# Patient Record
Sex: Female | Born: 1990 | Race: White | Hispanic: No | Smoking: Former smoker
Health system: Southern US, Community
[De-identification: ages and names within clinical notes are randomized; demographics above are authoritative.]

## PROBLEM LIST (undated history)

## (undated) DIAGNOSIS — F32A Depression, unspecified: Secondary | ICD-10-CM

## (undated) DIAGNOSIS — R7303 Prediabetes: Secondary | ICD-10-CM

## (undated) DIAGNOSIS — F419 Anxiety disorder, unspecified: Secondary | ICD-10-CM

## (undated) DIAGNOSIS — F319 Bipolar disorder, unspecified: Secondary | ICD-10-CM

## (undated) DIAGNOSIS — F909 Attention-deficit hyperactivity disorder, unspecified type: Secondary | ICD-10-CM

## (undated) DIAGNOSIS — M5136 Other intervertebral disc degeneration, lumbar region: Secondary | ICD-10-CM

## (undated) DIAGNOSIS — M51369 Other intervertebral disc degeneration, lumbar region without mention of lumbar back pain or lower extremity pain: Secondary | ICD-10-CM

## (undated) DIAGNOSIS — M797 Fibromyalgia: Secondary | ICD-10-CM

## (undated) DIAGNOSIS — F329 Major depressive disorder, single episode, unspecified: Secondary | ICD-10-CM

## (undated) DIAGNOSIS — E119 Type 2 diabetes mellitus without complications: Secondary | ICD-10-CM

## (undated) HISTORY — DX: Anxiety disorder, unspecified: F41.9

## (undated) HISTORY — PX: CHOLECYSTECTOMY: SHX55

## (undated) HISTORY — DX: Depression, unspecified: F32.A

## (undated) HISTORY — DX: Major depressive disorder, single episode, unspecified: F32.9

---

## 2010-01-02 ENCOUNTER — Encounter: Payer: Self-pay | Admitting: Internal Medicine

## 2010-01-17 ENCOUNTER — Encounter (INDEPENDENT_AMBULATORY_CARE_PROVIDER_SITE_OTHER): Payer: Self-pay | Admitting: *Deleted

## 2010-01-17 ENCOUNTER — Encounter: Payer: Self-pay | Admitting: Internal Medicine

## 2010-02-09 ENCOUNTER — Encounter: Payer: Self-pay | Admitting: Internal Medicine

## 2010-02-09 LAB — CONVERTED CEMR LAB
ALT: 172 units/L — ABNORMAL HIGH
AST: 101 units/L — ABNORMAL HIGH
Total Bilirubin: 1.2 mg/dL — ABNORMAL HIGH

## 2010-02-10 LAB — CONVERTED CEMR LAB
ALT: 194 units/L
Alkaline Phosphatase: 69 units/L

## 2010-02-11 ENCOUNTER — Encounter: Payer: Self-pay | Admitting: Internal Medicine

## 2010-02-11 LAB — CONVERTED CEMR LAB
ALT: 115 units/L
AST: 28 units/L
Alkaline Phosphatase: 57 units/L

## 2010-02-12 ENCOUNTER — Encounter: Payer: Self-pay | Admitting: Internal Medicine

## 2010-02-12 LAB — CONVERTED CEMR LAB
AST: 22 units/L
Total Bilirubin: 0.6 mg/dL

## 2010-02-15 ENCOUNTER — Ambulatory Visit: Payer: Self-pay | Admitting: Internal Medicine

## 2010-02-15 DIAGNOSIS — R112 Nausea with vomiting, unspecified: Secondary | ICD-10-CM | POA: Insufficient documentation

## 2010-02-15 DIAGNOSIS — R740 Nonspecific elevation of levels of transaminase and lactic acid dehydrogenase [LDH]: Secondary | ICD-10-CM

## 2010-02-15 DIAGNOSIS — R109 Unspecified abdominal pain: Secondary | ICD-10-CM | POA: Insufficient documentation

## 2010-02-20 ENCOUNTER — Telehealth: Payer: Self-pay | Admitting: Internal Medicine

## 2010-02-20 ENCOUNTER — Ambulatory Visit: Payer: Self-pay | Admitting: Internal Medicine

## 2010-02-20 DIAGNOSIS — K560 Paralytic ileus: Secondary | ICD-10-CM

## 2010-02-20 LAB — CONVERTED CEMR LAB
AST: 19 units/L (ref 0–37)
Basophils Absolute: 0 10*3/uL (ref 0.0–0.1)
Basophils Relative: 0.4 % (ref 0.0–3.0)
Eosinophils Absolute: 0.1 10*3/uL (ref 0.0–0.7)
Eosinophils Relative: 1.4 % (ref 0.0–5.0)
Lymphocytes Relative: 44.4 % (ref 12.0–46.0)
MCHC: 35 g/dL (ref 30.0–36.0)
MCV: 86.5 fL (ref 78.0–100.0)
Neutro Abs: 4.2 10*3/uL (ref 1.4–7.7)
RBC: 4.62 M/uL (ref 3.87–5.11)

## 2010-02-21 ENCOUNTER — Ambulatory Visit: Payer: Self-pay | Admitting: Internal Medicine

## 2010-02-21 ENCOUNTER — Telehealth: Payer: Self-pay | Admitting: Internal Medicine

## 2010-03-11 ENCOUNTER — Ambulatory Visit (HOSPITAL_COMMUNITY): Admission: RE | Admit: 2010-03-11 | Discharge: 2010-03-11 | Payer: Self-pay | Admitting: Internal Medicine

## 2010-05-22 ENCOUNTER — Telehealth (INDEPENDENT_AMBULATORY_CARE_PROVIDER_SITE_OTHER): Payer: Self-pay | Admitting: *Deleted

## 2010-08-16 ENCOUNTER — Telehealth: Payer: Self-pay | Admitting: Internal Medicine

## 2010-10-15 NOTE — Progress Notes (Signed)
  Phone Note Other Incoming   Request: Send information Summary of Call: Request received from Disability Determination Services forwarded to [employer].       

## 2010-10-15 NOTE — Letter (Signed)
Summary: [facility]   Imported By: Sherian Rein 02/22/2010 08:12:26  _____________________________________________________________________  External Attachment:    Type:   Image     Comment:   External Document

## 2010-10-15 NOTE — Progress Notes (Signed)
Summary: Triage  Phone Note Call from Patient Call back at Home Phone [phone]   Caller: Mother--Sally   Call For: Dr. Leone Payor Summary of Call: Wants to know if Dr. Leone Payor has reviewed records from [facility]. Pt. is having constipation and vomiting Initial call taken by: Karna Christmas,  February 20, 2010 2:04 PM  Follow-up for Phone Call        see append on 02/15/10 note from Dr Leone Payor.  Patient  will come this afternoon for labs and x-ray.  Mother states that patient has had continued pain and vomiting.  Requesting her to have something for nausea.  I discussed with Amy Esterwood PA [place], new oprders for phenergan 25 mg 1/2-1 by mouth q 4-6 hours as needed vomiting # 25 0 refills.  Amy Esterwood PA also wants to add cbc to labs  Follow-up by: Darcey Nora RN, CGRN,  February 20, 2010 2:37 PM  Additional Follow-up for Phone Call Additional follow up Details #1::       Additional Follow-up by: Iva Boop MD, Clementeen Graham,  February 21, 2010 9:43 AM  New Problems: ILEUS (ICD-560.1)   New Problems: ILEUS (ICD-560.1) New/Updated Medications: PROMETHAZINE HCL 25 MG TABS (PROMETHAZINE HCL) 1/2 -1 by mouth q 4-6 hours as needed nausea Prescriptions: PROMETHAZINE HCL 25 MG TABS (PROMETHAZINE HCL) 1/2 -1 by mouth q 4-6 hours as needed nausea  #25 x 0   Entered by:   Darcey Nora RN, CGRN   Authorized by:   Sammuel Cooper PA-c   Signed by:   Darcey Nora RN, CGRN on 02/20/2010   Method used:   Electronically to        [facility] (retail)       [address]       [place], [place]  [address]       Ph: [phone]       Fax: [phone]   RxID:   [id number]

## 2010-10-15 NOTE — Assessment & Plan Note (Signed)
Summary: NAUSEA//VOMITING//ABD PAIN   History of Present Illness Visit Type: Initial Consult Primary GI MD: Stan Head MD [facility] Primary Provider: Thea Silversmith, MD Requesting Provider: Thea Silversmith, MD Chief Complaint: N7V, abdominal pain History of Present Illness:   20 yo ww here with mom.Patient was in [facility] for abdominal pain and constipation. She was  discharged 02/12/10. MRCP and xrays performed. She saw Dr. Karilyn Cota  and he ? if she passed stone had appt here already so kept it Eating less, poor appetite,  but gaining weight. Barely a meal. every time she eats anything she has to go to bathroom to either vomit or defecate. The stool softener is keeping your bowels moving. Prior she had been constipated and was in ED a week ago, given enemas, then back and admitted with pancreatitis and obstruction. Mom says LFT's went up after normal on admit. rectal bleeding and pain did not start til enemas.  random abdominal pain in mid abdomen, sporadic and unpredictable. May awaken at night. She has had problems since 2006 cholecystectomy for stones and upper abdominal pain radiating to the back - the current issues began a few months after. Had seen GP but not much was done other than diet changes with reduced fat. On Augmentin sine dc. UTI, ? otherwise.  Records review performed after patient visit: [facility] 5/28-5/31/11 Abdominal pain with elevated LFT's (were normal day before she presented/admitted) MRCP with normal CBD, Dr. Karilyn Cota suggested tertiary referral for biliary manometry ? sphincter of Oddi dysfunction  HPI indicatess this was third episode of acute abdominal pian since cholecystectomy with similar features, RUQ pain, nausea and vomiting. Then more diffuse pain.    GI Review of Systems    Reports abdominal pain, belching, bloating, chest pain, loss of appetite, nausea, vomiting, and  weight gain.     Location of  Abdominal pain: right side.    Denies  acid reflux, dysphagia with liquids, dysphagia with solids, heartburn, vomiting blood, and  weight loss.      Reports change in bowel habits, constipation, diarrhea, rectal bleeding, and  rectal pain.     Denies anal fissure, black tarry stools, diverticulosis, fecal incontinence, heme positive stool, hemorrhoids, irritable bowel syndrome, jaundice, light color stool, and  liver problems. [place] of Abdomen  Procedure date:  02/09/2010  Findings:      Normal post-chole limited pancreatic views  MRCP  Procedure date:  02/11/2010  Findings:      CBD max 5.2 mm small amount perihepatic ascites Otherwise normal.  X-ray  Procedure date:  02/09/2010  Findings:      Acute Abd early partial SBOvs ileus with distended jejunal loops compared to 02/08/10   Preventive Screening-Counseling & Management  Alcohol-Tobacco     Smoking Status: quit      Drug Use:  no.      Current Medications (verified): 1)  Depo-Provera 150 Mg/ml Susp (Medroxyprogesterone Acetate) .Marland Kitchen.. 1 Ml Im Every 3 Months 2)  Proair Hfa 108 (90 Base) Mcg/act Aers (Albuterol Sulfate) .... Take 2 Puffs Daily As Needed 3)  Levothyroxine Sodium 25 Mcg Tabs (Levothyroxine Sodium) .... Once Daily 4)  Augmentin 875-125 Mg Tabs (Amoxicillin-Pot Clavulanate) .... Two Times A Day  Allergies (verified): 1)  Ceclor  Past History:  Past Medical History: Bipolar Asthma Depression Gallstones  Past Surgical History: Cholecystectomy  Family History: Family History of Diabetes: Father, Mother Family History of Breast Cancer: Leukemia: Brother Cervicle Cancer: Mother Family History of Heart Disease: Mother Family History of Irritable Bowel  Syndrome:Mother Family History of Kidney Disease:Mother  Social History: Single, no children Occupation: Unemployed Patient is a former smoker.  Alcohol Use - no Illicit Drug Use - no Certificate of completion but no HS DiplomaSmoking Status:  quit Drug Use:  no  Review of  Systems       The patient complains of allergy/sinus, arthritis/joint pain, back pain, confusion, cough, depression-new, fatigue, fever, headaches-new, hearing problems, heart murmur, shortness of breath, skin rash, sleeping problems, sore throat, swelling of feet/legs, and urination - excessive.         Of Bipolar meds because weren't effective and sedating side effects. Looking for new psychiatrist. All other ROS negative except as per HPI.   Vital Signs:  Patient profile:   20 year old female Height:      60 inches Weight:      131.38 pounds BMI:     25.75 Pulse rate:   80 / minute Pulse rhythm:   regular BP sitting:   92 / 56  (left arm) Cuff size:   regular  Vitals Entered By: June McMurray CMA Duncan Dull) (February 15, 2010 8:31 AM)  Physical Exam  General:  Well developed, well nourished, no acute distress. Head:  Normocephalic and atraumatic. Eyes:  PERRLA, no icterus. Mouth:  No deformity or lesions, dentition normal. Neck:  Supple; no masses or thyromegaly. Lungs:  Clear throughout to auscultation. Heart:  Regular rate and rhythm; no murmurs, rubs,  or bruits. Abdomen:  mildly tender upper abdomen, right and epigastrium soft BS+ without HSM/mass abd tenderness worse wth mm tension Neurologic:  Alert and  oriented x4;  grossly normal neurologically. Cervical Nodes:  No significant cervical or supraclavicular adenopathy.  Psych:  slightly inappropriate (laughing ) at times   Impression & Recommendations:  Problem # 1:  ABDOMINAL PAIN, UPPER (ICD-789.09) Assessment New Three episodes since cholecystectomy. RUQ then more diffuse. this time we know LFT's rose and fell. No CBD stone on MRCP and no ductal dilation and no pancreatitis. Etiiology not clear and other objective data suggested ileus or partial SBO.  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 Seems unusual to be constipated since cholecystectomy but has been hypothyroid, but TSH normal when she was admitted. Need to  clarify if it is similar to pain pre chole.  Problem # 2:  TRANSAMINASES, SERUM, ELEVATED (ICD-790.4) Assessment: New ? from biliary source or other small amount of peri-heaptic ascites noted cause of this transient elevation with pain not clear but biliary tract remains leading suspect sphincter of Oiddi dysfunction is possible she is young and at higher risk of complications from ERCP but there may be a dynamic imaging study (non-invasive) possible.  will discuss options now that I have been able to review the [place] records.  Problem # 3:  NAUSEA AND VOMITING (ICD-787.01) Assessment: New Associated with the abdominal pain episodes.  Patient Instructions: 1)  Please continue current medications.  2)  We will call you with further plans and follow up after we receive and review your records from [facility]. 3)  Copy sent to : Valla Leaver, MD 4)  The medication list was reviewed and reconciled.  All changed / newly prescribed medications were explained.  A complete medication list was provided to the patient / caregiver.  Appended Document: NAUSEA//VOMITING//ABD PAIN ask her to repeat LFT's and 2 view abdomen to follow-up ileus/SBO  Appended Document: NAUSEA//VOMITING//ABD PAIN   Gastric Emptying Study  Procedure date:  03/11/2010  Findings:      Normal:  Let her know its normal How is she?  Appended Document: NAUSEA//VOMITING//ABD PAIN patient aware of results.  She states she is doing well "I really can't complain".  She is asked to call back for any questions or concerns  Appended Document: NAUSEA//VOMITING//ABD PAIN cc this to her PCP  Appended Document: NAUSEA//VOMITING//ABD PAIN sent to Dr Avel Peace

## 2010-10-15 NOTE — Letter (Signed)
Summary: [facility]   Imported By: Sherian Rein 02/22/2010 08:25:35  _____________________________________________________________________  External Attachment:    Type:   Image     Comment:   External Document

## 2010-10-15 NOTE — Procedures (Signed)
Summary: Upper Endoscopy  Patient: Amber Henry Note: All result statuses are Final unless otherwise noted.  Tests: (1) Upper Endoscopy (EGD)   EGD Upper Endoscopy       DONE     [facility]     [address]     [place], [place]  [address]           ENDOSCOPY PROCEDURE REPORT           PATIENT:  Amber, Henry  MR#:  [id number]     BIRTHDATE:  [birth year], 18 yrs. old  GENDER:  female           ENDOSCOPIST:  Iva Boop, MD, [facility]     Referred by:  Avel Peace, M.D.           PROCEDURE DATE:  02/21/2010     PROCEDURE:  EGD with biopsy     ASA CLASS:  Class II     INDICATIONS:  abdominal pain, nausea and vomiting           MEDICATIONS:   Benadryl 12.5 mg IV, Versed 7 mg IV, Fentanyl 75     mcg IV     TOPICAL ANESTHETIC:  Exactacain Spray           DESCRIPTION OF PROCEDURE:   After the risks benefits and     alternatives of the procedure were thoroughly explained, informed     consent was obtained.  The LB [id number]-H180 [id number] endoscope was     introduced through the mouth and advanced to the second portion of     the duodenum, without limitations.  The instrument was slowly     withdrawn as the mucosa was fully examined.     <<PROCEDUREIMAGES>>           Moderate gastritis was found in the body and the antrum of the     stomach. It was mottled and erythematous. Multiple biopsies were     obtained and sent to pathology.  Otherwise the examination was     normal. The Z-line was at 39 cm and the major papilla was seen and     normal.    Retroflexed views revealed no abnormalities.    The     scope was then withdrawn from the patient and the procedure     completed.           COMPLICATIONS:  None           ENDOSCOPIC IMPRESSION:     1) Moderate gastritis in the body and the antrum of the stomach           2) Otherwise normal examination     RECOMMENDATIONS:     1) Await biopsy results     Take MiraLax 1-2 doses each day for constipation. Start      hyoscyamine 0.125 mg sublingual as needed every 4 hours for     abdominal pain (prescription sent)           REPEAT EXAM:  In for as needed.           Iva Boop, MD, Clementeen Graham           CC:  Avel Peace, MD and The Patient           n.     eSIGNED:   Iva Boop at 02/21/2010 12:59 PM           Colon Flattery, [id number]  Note: An exclamation mark Marland Kitchen)  indicates a result that was not dispersed into the flowsheet. Document Creation Date: 02/21/2010 12:59 PM _______________________________________________________________________  (1) Order result status: Final Collection or observation date-time: 02/21/2010 12:45 Requested date-time:  Receipt date-time:  Reported date-time:  Referring Physician:   Ordering Physician: Stan Head [phone]) Specimen Source:  Source: Launa Grill Order Number: [id number] Lab site:

## 2010-10-15 NOTE — Letter (Signed)
Summary: Consult/[facility]   Imported By: Sherian Rein 02/22/2010 08:23:29  _____________________________________________________________________  External Attachment:    Type:   Image     Comment:   External Document

## 2010-10-15 NOTE — Progress Notes (Signed)
Summary: Schedule EGD  Phone Note Outgoing Call   Summary of Call: let her know labs and xray are ok is going to need some more diagnostic testing and I recommend an EGD re abdominal pain and vomiting. could do Mon at [facility] or possibly 3ish today in [facility] if we can pull that of Initial call taken by: Iva Boop MD, Clementeen Graham,  February 21, 2010 9:46 AM  Follow-up for Phone Call        patient is scheduled for today at 3:00 in [facility].  I have reviewed insturctions with the patient's mom. Follow-up by: Darcey Nora RN, CGRN,  February 21, 2010 10:14 AM     Appended Document: Schedule EGD -

## 2010-10-15 NOTE — Letter (Signed)
Summary: [facility]   Imported By: Sherian Rein 02/22/2010 08:14:51  _____________________________________________________________________  External Attachment:    Type:   Image     Comment:   External Document

## 2010-10-15 NOTE — Letter (Signed)
Summary: New Patient letter  [facility]  [address] [place], [place] [address]   Phone: [phone]  Fax: [phone]       01/17/2010 MRN: [id number]  [facility] WHITE [address] [place], [place]  [address]  Dear Ms. WHITE,  Welcome to the Gastroenterology Division at [facility]    You are scheduled to see Dr. Leone Payor on 02-15-10 at 8:45a.m. on the 3rd floor at [facility], [address]  We ask that you try to arrive at our office 15 minutes prior to your appointment time to allow for check-in.  We would like you to complete the enclosed self-administered evaluation form prior to your visit and bring it with you on the day of your appointment.  We will review it with you.  Also, please bring a complete list of all your medications or, if you prefer, bring the medication bottles and we will list them.  Please bring your insurance card so that we may make a copy of it.  If your insurance requires a referral to see a specialist, please bring your referral form from your primary care physician.  Co-payments are due at the time of your visit and may be paid by cash, check or credit card.     Your office visit will consist of a consult with your physician (includes a physical exam), any laboratory testing he/she may order, scheduling of any necessary diagnostic testing (e.g. x-ray, ultrasound, CT-scan), and scheduling of a procedure (e.g. Endoscopy, Colonoscopy) if required.  Please allow enough time on your schedule to allow for any/all of these possibilities.    If you cannot keep your appointment, please call [phone] to cancel or reschedule prior to your appointment date.  This allows [place] the opportunity to schedule an appointment for another patient in need of care.  If you do not cancel or reschedule by 5 p.m. the business day prior to your appointment date, you will be charged a $50.00 late cancellation/no-show fee.    Thank you for choosing [facility] for your medical needs.  We appreciate the opportunity to care for you.  Please visit [place] at our website  to learn more about our practice.                     Sincerely,                                                             The Gastroenterology Division
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
## 2010-12-20 ENCOUNTER — Emergency Department (HOSPITAL_COMMUNITY)
Admission: EM | Admit: 2010-12-20 | Discharge: 2010-12-20 | Disposition: A | Discharge status: Home / Self Care | Payer: Medicaid Other | Attending: Emergency Medicine | Admitting: Emergency Medicine

## 2010-12-20 DIAGNOSIS — R05 Cough: Secondary | ICD-10-CM | POA: Insufficient documentation

## 2010-12-20 DIAGNOSIS — R059 Cough, unspecified: Secondary | ICD-10-CM | POA: Insufficient documentation

## 2010-12-20 DIAGNOSIS — J45909 Unspecified asthma, uncomplicated: Secondary | ICD-10-CM | POA: Insufficient documentation

## 2010-12-20 DIAGNOSIS — F319 Bipolar disorder, unspecified: Secondary | ICD-10-CM | POA: Insufficient documentation

## 2010-12-20 DIAGNOSIS — E039 Hypothyroidism, unspecified: Secondary | ICD-10-CM | POA: Insufficient documentation

## 2011-05-20 ENCOUNTER — Other Ambulatory Visit (HOSPITAL_COMMUNITY): Payer: Self-pay | Admitting: "Endocrinology

## 2011-05-20 DIAGNOSIS — N6452 Nipple discharge: Secondary | ICD-10-CM

## 2011-05-22 ENCOUNTER — Other Ambulatory Visit (HOSPITAL_COMMUNITY): Payer: Self-pay | Admitting: "Endocrinology

## 2011-05-23 ENCOUNTER — Other Ambulatory Visit (HOSPITAL_COMMUNITY): Payer: Self-pay | Admitting: "Endocrinology

## 2011-05-27 ENCOUNTER — Ambulatory Visit (HOSPITAL_COMMUNITY): Payer: Medicaid Other

## 2011-05-28 ENCOUNTER — Ambulatory Visit (HOSPITAL_COMMUNITY)
Admission: RE | Admit: 2011-05-28 | Discharge: 2011-05-28 | Disposition: A | Discharge status: Home / Self Care | Payer: Medicaid Other | Source: Ambulatory Visit | Attending: "Endocrinology | Admitting: "Endocrinology

## 2011-05-28 DIAGNOSIS — N6452 Nipple discharge: Secondary | ICD-10-CM

## 2011-05-28 DIAGNOSIS — N6459 Other signs and symptoms in breast: Secondary | ICD-10-CM | POA: Insufficient documentation

## 2011-05-29 ENCOUNTER — Ambulatory Visit (HOSPITAL_COMMUNITY)
Admission: RE | Admit: 2011-05-29 | Discharge: 2011-05-29 | Disposition: A | Discharge status: Home / Self Care | Payer: Medicaid Other | Source: Ambulatory Visit | Attending: "Endocrinology | Admitting: "Endocrinology

## 2011-05-29 DIAGNOSIS — E236 Other disorders of pituitary gland: Secondary | ICD-10-CM | POA: Insufficient documentation

## 2011-05-29 MED ORDER — GADOBENATE DIMEGLUMINE 529 MG/ML IV SOLN
7.0000 mL | Freq: Once | INTRAVENOUS | Status: AC | PRN
  Administered 2011-05-29: 7 mL via INTRAVENOUS

## 2011-06-04 ENCOUNTER — Ambulatory Visit (HOSPITAL_COMMUNITY): Payer: Medicaid Other

## 2011-09-05 ENCOUNTER — Encounter: Payer: Self-pay | Admitting: Emergency Medicine

## 2011-09-05 ENCOUNTER — Emergency Department (HOSPITAL_COMMUNITY)
Admission: EM | Admit: 2011-09-05 | Discharge: 2011-09-05 | Disposition: A | Discharge status: Home / Self Care | Payer: Medicaid Other | Attending: Emergency Medicine | Admitting: Emergency Medicine

## 2011-09-05 DIAGNOSIS — F909 Attention-deficit hyperactivity disorder, unspecified type: Secondary | ICD-10-CM | POA: Insufficient documentation

## 2011-09-05 DIAGNOSIS — Z79899 Other long term (current) drug therapy: Secondary | ICD-10-CM | POA: Insufficient documentation

## 2011-09-05 DIAGNOSIS — W108XXA Fall (on) (from) other stairs and steps, initial encounter: Secondary | ICD-10-CM | POA: Insufficient documentation

## 2011-09-05 DIAGNOSIS — F411 Generalized anxiety disorder: Secondary | ICD-10-CM | POA: Insufficient documentation

## 2011-09-05 DIAGNOSIS — M5380 Other specified dorsopathies, site unspecified: Secondary | ICD-10-CM | POA: Insufficient documentation

## 2011-09-05 DIAGNOSIS — IMO0001 Reserved for inherently not codable concepts without codable children: Secondary | ICD-10-CM | POA: Insufficient documentation

## 2011-09-05 DIAGNOSIS — R450 Nervousness: Secondary | ICD-10-CM | POA: Insufficient documentation

## 2011-09-05 DIAGNOSIS — F319 Bipolar disorder, unspecified: Secondary | ICD-10-CM | POA: Insufficient documentation

## 2011-09-05 DIAGNOSIS — M545 Low back pain, unspecified: Secondary | ICD-10-CM | POA: Insufficient documentation

## 2011-09-05 DIAGNOSIS — S335XXA Sprain of ligaments of lumbar spine, initial encounter: Secondary | ICD-10-CM | POA: Insufficient documentation

## 2011-09-05 DIAGNOSIS — J45909 Unspecified asthma, uncomplicated: Secondary | ICD-10-CM | POA: Insufficient documentation

## 2011-09-05 DIAGNOSIS — S39012A Strain of muscle, fascia and tendon of lower back, initial encounter: Secondary | ICD-10-CM

## 2011-09-05 DIAGNOSIS — S20229A Contusion of unspecified back wall of thorax, initial encounter: Secondary | ICD-10-CM

## 2011-09-05 HISTORY — DX: Attention-deficit hyperactivity disorder, unspecified type: F90.9

## 2011-09-05 HISTORY — DX: Prediabetes: R73.03

## 2011-09-05 HISTORY — DX: Fibromyalgia: M79.7

## 2011-09-05 HISTORY — DX: Bipolar disorder, unspecified: F31.9

## 2011-09-05 MED ORDER — HYDROMORPHONE HCL PF 1 MG/ML IJ SOLN
1.0000 mg | Freq: Once | INTRAMUSCULAR | Status: AC
  Administered 2011-09-05: 1 mg via INTRAMUSCULAR
  Filled 2011-09-05: qty 1

## 2011-09-05 MED ORDER — HYDROCODONE-ACETAMINOPHEN 7.5-325 MG PO TABS: 1.0000 | ORAL_TABLET | ORAL | Status: AC | PRN

## 2011-09-05 MED ORDER — DEXAMETHASONE SODIUM PHOSPHATE 4 MG/ML IJ SOLN
8.0000 mg | Freq: Once | INTRAMUSCULAR | Status: AC
  Administered 2011-09-05: 8 mg via INTRAMUSCULAR
  Filled 2011-09-05: qty 2

## 2011-09-05 MED ORDER — DEXAMETHASONE 6 MG PO TABS: ORAL_TABLET | ORAL | Status: AC

## 2011-09-05 MED ORDER — ONDANSETRON HCL 4 MG PO TABS
4.0000 mg | ORAL_TABLET | Freq: Once | ORAL | Status: AC
  Administered 2011-09-05: 4 mg via ORAL
  Filled 2011-09-05: qty 1

## 2011-09-05 NOTE — ED Notes (Signed)
Pt presents with low back pain. Pt states she fell backwards onto her back yesterday. Pt states she has a ruptured disc and "fluid is leaking up and down my spine". No swelling, bruising, or step-off noted to back. Pt states she tried tasking Tylenol for pain but no relief was obtained from medication. Pt states she is out of her pain medication and was told to come to ED from her back specialist. NAD at this time.

## 2011-09-05 NOTE — ED Provider Notes (Signed)
History     CSN: [id number]  Arrival date & time 09/05/11  1057   None     Chief Complaint  Patient presents with  . Fall  . Back Pain    (Consider location/radiation/quality/duration/timing/severity/associated sxs/prior treatment) HPI Comments: Patient states that she fell down approximately 2 steps on yesterday December 20. It is of note she has history of fibromyalgia and degenerative disc disease. She has had increasing lower back pain and spasm since the fall. She denies loss of bowel or bladder function. She requests assistance with her pain at this time.  Patient is a 20 y.o. female presenting with fall and back pain. The history is provided by the patient.  Fall The accident occurred yesterday. Incident: fell down 2 stairs. She landed on concrete. Point of impact: lower back. The pain is severe. She was ambulatory at the scene. Pertinent negatives include no numbness, no abdominal pain, no bowel incontinence and no hematuria.  Back Pain  Pertinent negatives include no chest pain, no numbness, no abdominal pain, no bowel incontinence and no dysuria.    Past Medical History  Diagnosis Date  . Borderline diabetes   . Fibromyalgia   . ADHD (attention deficit hyperactivity disorder)   . Asthma   . Bipolar 1 disorder     History reviewed. No pertinent past surgical history.  History reviewed. No pertinent family history.  History  Substance Use Topics  . Smoking status: Not on file  . Smokeless tobacco: Not on file  . Alcohol Use: No    OB History    Grav Para Term Preterm Abortions TAB SAB Ect Mult Living                  Review of Systems  Constitutional: Negative for activity change.       All ROS Neg except as noted in HPI  HENT: Negative for nosebleeds and neck pain.   Eyes: Negative for photophobia and discharge.  Respiratory: Negative for cough, shortness of breath and wheezing.   Cardiovascular: Negative for chest pain and palpitations.    Gastrointestinal: Negative for abdominal pain, blood in stool and bowel incontinence.  Genitourinary: Negative for dysuria, frequency and hematuria.  Musculoskeletal: Positive for myalgias, back pain and arthralgias.  Skin: Negative.   Neurological: Negative for dizziness, seizures, speech difficulty and numbness.  Psychiatric/Behavioral: Negative for hallucinations and confusion. The patient is nervous/anxious.     Allergies  Peanut butter flavor; Amoxicillin; Bactrim; Cefaclor; and Penicillins  Home Medications   Current Outpatient Rx  Name Route Sig Dispense Refill  . ACETAMINOPHEN 500 MG PO TABS Oral Take 1,000 mg by mouth every 6 (six) hours as needed. For pain     . ARIPIPRAZOLE 5 MG PO TABS Oral Take 5 mg by mouth daily.      Marland Kitchen LANSOPRAZOLE 30 MG PO CPDR Oral Take 30 mg by mouth daily.      Marland Kitchen LEVOTHYROXINE SODIUM 75 MCG PO TABS Oral Take 75 mcg by mouth daily.      . ICY HOT BACK EX Apply externally Apply 1 patch topically 2 (two) times daily.      Marland Kitchen PREGABALIN 25 MG PO CAPS Oral Take 25 mg by mouth 3 (three) times daily.      Marland Kitchen TEMAZEPAM 30 MG PO CAPS Oral Take 30 mg by mouth at bedtime as needed. For sleep       BP 130/69  Pulse 84  Temp(Src) 97.5 F (36.4 C) (Oral)  Resp 18  Ht 5\' 2"  (1.575 m)  Wt 158 lb (71.668 kg)  BMI 28.90 kg/m2  SpO2 100%  LMP 09/03/2011  Physical Exam  Nursing note and vitals reviewed. Constitutional: She is oriented to person, place, and time. She appears well-developed and well-nourished.  Non-toxic appearance.  HENT:  Head: Normocephalic.  Right Ear: Tympanic membrane and external ear normal.  Left Ear: Tympanic membrane and external ear normal.  Eyes: EOM and lids are normal. Pupils are equal, round, and reactive to light.  Neck: Normal range of motion. Neck supple. Carotid bruit is not present.  Cardiovascular: Normal rate, regular rhythm, normal heart sounds, intact distal pulses and normal pulses.   Pulmonary/Chest: Breath sounds  normal. No respiratory distress.  Abdominal: Soft. Bowel sounds are normal. There is no tenderness. There is no guarding.  Musculoskeletal: Normal range of motion.       Spasm noted of the right and left lower back. Pain with range of motion of the lumbar area. No acute bruising noted. No palpable step down in the thoracic or lumbar area.  Lymphadenopathy:       Head (right side): No submandibular adenopathy present.       Head (left side): No submandibular adenopathy present.    She has no cervical adenopathy.  Neurological: She is alert and oriented to person, place, and time. She has normal strength. No cranial nerve deficit or sensory deficit.  Skin: Skin is warm and dry.  Psychiatric: Her speech is normal. Her mood appears anxious.    ED Course  Procedures (including critical care time) Pulse oximetry 100% on room air. Within normal limits by my interpretation. Labs Reviewed - No data to display No results found.   No diagnosis found.    MDM  I have reviewed nursing notes, vital signs, and all appropriate lab and imaging results for this patient. Back pain present that is worse than usual after stain and fall. Rx for Norco 7.5 and Decadron given. Pt to see her MD for recheck.        Kathie Dike, [place] 09/05/11 [phone]

## 2011-09-05 NOTE — ED Notes (Signed)
Pt a/ox4. Resp even and unlabored. NAD at this time. D/C instructions and Rx x2 reviewed with pt. Pt verbalized understanding. Pt ambulated to lobby with steady gate.  

## 2011-09-05 NOTE — ED Notes (Signed)
Pt states she fell yesterday and c/o increased back pain. Pt states she has chronic back pain issues.

## 2011-09-06 NOTE — ED Provider Notes (Signed)
Medical screening examination/treatment/procedure(s) were performed by non-physician practitioner and as supervising physician I was immediately available for consultation/collaboration.    Nelia Shi, MD 09/06/11 [phone]

## 2012-08-06 ENCOUNTER — Emergency Department (HOSPITAL_COMMUNITY): Payer: Medicaid Other

## 2012-08-06 ENCOUNTER — Emergency Department (HOSPITAL_COMMUNITY)
Admission: EM | Admit: 2012-08-06 | Discharge: 2012-08-06 | Disposition: A | Discharge status: Home / Self Care | Payer: Medicaid Other | Attending: Emergency Medicine | Admitting: Emergency Medicine

## 2012-08-06 ENCOUNTER — Encounter (HOSPITAL_COMMUNITY): Payer: Self-pay | Admitting: *Deleted

## 2012-08-06 DIAGNOSIS — M5431 Sciatica, right side: Secondary | ICD-10-CM

## 2012-08-06 DIAGNOSIS — Z79899 Other long term (current) drug therapy: Secondary | ICD-10-CM | POA: Insufficient documentation

## 2012-08-06 DIAGNOSIS — F909 Attention-deficit hyperactivity disorder, unspecified type: Secondary | ICD-10-CM | POA: Insufficient documentation

## 2012-08-06 DIAGNOSIS — M545 Low back pain, unspecified: Secondary | ICD-10-CM | POA: Insufficient documentation

## 2012-08-06 DIAGNOSIS — IMO0001 Reserved for inherently not codable concepts without codable children: Secondary | ICD-10-CM | POA: Insufficient documentation

## 2012-08-06 DIAGNOSIS — F319 Bipolar disorder, unspecified: Secondary | ICD-10-CM | POA: Insufficient documentation

## 2012-08-06 DIAGNOSIS — M51379 Other intervertebral disc degeneration, lumbosacral region without mention of lumbar back pain or lower extremity pain: Secondary | ICD-10-CM | POA: Insufficient documentation

## 2012-08-06 DIAGNOSIS — E119 Type 2 diabetes mellitus without complications: Secondary | ICD-10-CM | POA: Insufficient documentation

## 2012-08-06 DIAGNOSIS — M5137 Other intervertebral disc degeneration, lumbosacral region: Secondary | ICD-10-CM | POA: Insufficient documentation

## 2012-08-06 DIAGNOSIS — J45909 Unspecified asthma, uncomplicated: Secondary | ICD-10-CM | POA: Insufficient documentation

## 2012-08-06 DIAGNOSIS — M5430 Sciatica, unspecified side: Secondary | ICD-10-CM | POA: Insufficient documentation

## 2012-08-06 HISTORY — DX: Other intervertebral disc degeneration, lumbar region: M51.36

## 2012-08-06 HISTORY — DX: Other intervertebral disc degeneration, lumbar region without mention of lumbar back pain or lower extremity pain: M51.369

## 2012-08-06 LAB — URINE MICROSCOPIC-ADD ON

## 2012-08-06 LAB — URINALYSIS, ROUTINE W REFLEX MICROSCOPIC
Bilirubin Urine: NEGATIVE
Glucose, UA: NEGATIVE mg/dL
Specific Gravity, Urine: 1.02 (ref 1.005–1.030)
Urobilinogen, UA: 0.2 mg/dL (ref 0.0–1.0)
pH: 6 (ref 5.0–8.0)

## 2012-08-06 LAB — PREGNANCY, URINE: Preg Test, Ur: NEGATIVE

## 2012-08-06 MED ORDER — HYDROCODONE-ACETAMINOPHEN 5-325 MG PO TABS: ORAL_TABLET | ORAL | Status: DC

## 2012-08-06 MED ORDER — HYDROCODONE-ACETAMINOPHEN 5-325 MG PO TABS
1.0000 | ORAL_TABLET | Freq: Once | ORAL | Status: AC
  Administered 2012-08-06: 1 via ORAL
  Filled 2012-08-06: qty 1

## 2012-08-06 NOTE — ED Notes (Signed)
Back pain, Has been seen at 3 hospitals for same. Given pain med. At ER.  And prednisone.    Had sudden onset of pain in back when bent over to pick up clothing.

## 2012-08-06 NOTE — ED Provider Notes (Signed)
History     CSN: [id number]  Arrival date & time 08/06/12  1646   First MD Initiated Contact with Patient 08/06/12 1706      Chief Complaint  Patient presents with  . 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  She was seen in [place] and dx with UTI.  She denies having any UTI sxs at time of dx or presently.  The macrobid has not improved her sxs.  No fever or chills.  She has also been seen at [facility] and [facility].  She was given pain meds at both places.   She does not know the diagnoses given at those two ED's.  She has a PCP in [place] and was referred to a "spine specialist" here in [place].  She has appts to see both of them in 3 days.  LMP-began yest.  Patient is a 21 y.o. female presenting with back pain. The history is provided by the patient. No language interpreter was used.  Back Pain  This is a new problem. Episode onset: ~ 4 days ago. The problem occurs constantly. The problem has not changed since onset.Associated with: pain started when she bent over to get some clothes out of a dresser drawer. The pain is present in the lumbar spine. The quality of the pain is described as stabbing. The pain radiates to the left foot. The pain is severe. The symptoms are aggravated by bending, twisting and certain positions. Pertinent negatives include no fever, no dysuria and no pelvic pain.    Past Medical History  Diagnosis Date  . Borderline diabetes   . Fibromyalgia   . ADHD (attention deficit hyperactivity disorder)   . Asthma   . Bipolar 1 disorder   . DDD (degenerative disc disease), lumbar     Past Surgical History  Procedure Date  . Cholecystectomy     History reviewed. No pertinent family history.  History  Substance Use Topics  . Smoking status: Not on file  . Smokeless tobacco: Not on file  . Alcohol Use: No     OB History    Grav Para Term Preterm Abortions TAB SAB Ect Mult Living                  Review of Systems  Constitutional: Negative for fever and chills.  Genitourinary: Positive for vaginal bleeding. Negative for dysuria, urgency, frequency, hematuria, flank pain, vaginal discharge, vaginal pain and pelvic pain.  Musculoskeletal: Positive for back pain.  All other systems reviewed and are negative.    Allergies  Peanut butter flavor; Amoxicillin; Bactrim; Penicillins; Cefaclor; and Latex  Home Medications   Current Outpatient Rx  Name  Route  Sig  Dispense  Refill  . ARIPIPRAZOLE 15 MG PO TABS   Oral   Take 15 mg by mouth daily.         Marland Kitchen CETIRIZINE HCL 10 MG PO TABS   Oral   Take 10 mg by mouth daily.         . CHOLECALCIFEROL 400 UNITS PO TABS   Oral   Take 400 Units by mouth daily.         Marland Kitchen DICLOFENAC SODIUM 1 % TD GEL   Topical   Apply 1 application topically daily as needed.         . DULOXETINE HCL 60 MG PO CPEP   Oral   Take 60  mg by mouth daily.         Marland Kitchen FLUTICASONE PROPIONATE 50 MCG/ACT NA SUSP   Nasal   Place 2 sprays into the nose daily as needed. For allergies and congestion         . HYDROXYZINE HCL 50 MG PO TABS   Oral   Take 50-100 mg by mouth 2 (two) times daily. Take one tablet in the morning and two tablets at bedtime         . IBUPROFEN 800 MG PO TABS   Oral   Take 800 mg by mouth 3 (three) times daily as needed.         Marland Kitchen LEVOTHYROXINE SODIUM 75 MCG PO TABS   Oral   Take 75 mcg by mouth daily.           . ICY HOT BACK EX   Apply externally   Apply 1 patch topically 2 (two) times daily.           Marland Kitchen NITROFURANTOIN MACROCRYSTAL 100 MG PO CAPS   Oral   Take 100 mg by mouth 4 (four) times daily.         Marland Kitchen OMEPRAZOLE 20 MG PO CPDR   Oral   Take 20 mg by mouth every morning.         Marland Kitchen PREGABALIN 100 MG PO CAPS   Oral   Take 100 mg by mouth 3 (three) times daily.         Marland Kitchen TIZANIDINE HCL 4 MG PO  TABS   Oral   Take 4 mg by mouth every 8 (eight) hours as needed.         Marland Kitchen TRAMADOL HCL 50 MG PO TABS   Oral   Take 50-100 mg by mouth every 6 (six) hours as needed.         Marland Kitchen HYDROCODONE-ACETAMINOPHEN 5-325 MG PO TABS      One tab po q 4-6 hrs prn pain   20 tablet   0     BP 133/84  Pulse 88  Temp 98.2 F (36.8 C) (Oral)  Resp 18  Ht 5' (1.524 m)  Wt 188 lb (85.276 kg)  BMI 36.72 kg/m2  SpO2 99%  LMP 08/05/2012  Physical Exam  Nursing note and vitals reviewed. Constitutional: She is oriented to person, place, and time. She appears well-developed and well-nourished. No distress.  HENT:  Head: Normocephalic and atraumatic.  Eyes: EOM are normal.  Neck: Normal range of motion.  Cardiovascular: Normal rate and regular rhythm.   Pulmonary/Chest: Effort normal.  Abdominal: Soft. She exhibits no distension. There is no tenderness.  Musculoskeletal: She exhibits tenderness.       Lumbar back: She exhibits decreased range of motion, tenderness and pain. She exhibits no bony tenderness, no swelling, no spasm and normal pulse.       Back:  Neurological: She is alert and oriented to person, place, and time. She has normal strength. No sensory deficit. She displays a negative Romberg sign. Coordination and gait normal. GCS eye subscore is 4. GCS verbal subscore is 5. GCS motor subscore is 6.  Reflex Scores:      Patellar reflexes are 2+ on the right side and 2+ on the left side.      Achilles reflexes are 2+ on the right side and 2+ on the left side. Skin: Skin is warm and dry.  Psychiatric: She has a normal mood and affect. Judgment normal.    ED Course  Procedures (  including critical care time)  Labs Reviewed  URINALYSIS, ROUTINE W REFLEX MICROSCOPIC - Abnormal; Notable for the following:    Hgb urine dipstick LARGE (*)     All other components within normal limits  URINE MICROSCOPIC-ADD ON - Abnormal; Notable for the following:    Squamous Epithelial / LPF FEW (*)      Bacteria, UA FEW (*)     All other components within normal limits  PREGNANCY, URINE   Dg Lumbar Spine Complete  08/06/2012  *RADIOLOGY REPORT*  Clinical Data: Back pain. No trauma history submitted.  LUMBAR SPINE - COMPLETE 4+ VIEW  Comparison: CT 11/14/2011  Findings: Diminutive 12th ribs. Five lumbar type vertebral bodies. Sacroiliac joints are symmetric.  Cholecystectomy. Maintenance of vertebral body height and alignment.  Mild loss of intervertebral disc height at the lumbosacral junction.  IMPRESSION: No acute osseous abnormality.   Original Report Authenticated By: Jeronimo Greaves, M.D.      1. Lumbar back pain   2. Right sided sciatica       MDM  rx-hydrocodone, 20, ice F/u with PCP and spine specialist as planned.        Evalina Field, [place] 08/06/12 1956

## 2012-08-06 NOTE — ED Notes (Signed)
Pt presents with lower back pain after bending over to pick up something off the floor. Pt states pain has increased since. Denies bowel and bladder function at this time. Pt states has had back trouble since birth. NAD noted.

## 2012-08-07 NOTE — ED Provider Notes (Signed)
Medical screening examination/treatment/procedure(s) were performed by non-physician practitioner and as supervising physician I was immediately available for consultation/collaboration.   Shelda Jakes, MD 08/07/12 [phone]

## 2012-12-03 ENCOUNTER — Ambulatory Visit (INDEPENDENT_AMBULATORY_CARE_PROVIDER_SITE_OTHER): Payer: Medicaid Other | Admitting: Adult Health

## 2012-12-03 VITALS — BP 140/82 | Wt 184.6 lb

## 2012-12-03 DIAGNOSIS — Z309 Encounter for contraceptive management, unspecified: Secondary | ICD-10-CM

## 2012-12-03 DIAGNOSIS — Z3049 Encounter for surveillance of other contraceptives: Secondary | ICD-10-CM

## 2012-12-03 DIAGNOSIS — Z3202 Encounter for pregnancy test, result negative: Secondary | ICD-10-CM

## 2012-12-03 LAB — POCT URINE PREGNANCY: Preg Test, Ur: NEGATIVE

## 2012-12-03 MED ORDER — MEDROXYPROGESTERONE ACETATE 150 MG/ML IM SUSP
150.0000 mg | Freq: Once | INTRAMUSCULAR | Status: AC
  Administered 2012-12-03: 150 mg via INTRAMUSCULAR

## 2012-12-03 NOTE — Addendum Note (Signed)
Addended by: Colen Darling on: 12/03/2012 10:46 AM   Modules accepted: Level of Service

## 2013-02-08 ENCOUNTER — Encounter (HOSPITAL_COMMUNITY): Payer: Self-pay

## 2013-02-08 DIAGNOSIS — Z862 Personal history of diseases of the blood and blood-forming organs and certain disorders involving the immune mechanism: Secondary | ICD-10-CM | POA: Insufficient documentation

## 2013-02-08 DIAGNOSIS — Z8739 Personal history of other diseases of the musculoskeletal system and connective tissue: Secondary | ICD-10-CM | POA: Insufficient documentation

## 2013-02-08 DIAGNOSIS — IMO0001 Reserved for inherently not codable concepts without codable children: Secondary | ICD-10-CM | POA: Insufficient documentation

## 2013-02-08 DIAGNOSIS — F319 Bipolar disorder, unspecified: Secondary | ICD-10-CM | POA: Insufficient documentation

## 2013-02-08 DIAGNOSIS — S0993XA Unspecified injury of face, initial encounter: Secondary | ICD-10-CM | POA: Insufficient documentation

## 2013-02-08 DIAGNOSIS — Z87891 Personal history of nicotine dependence: Secondary | ICD-10-CM | POA: Insufficient documentation

## 2013-02-08 DIAGNOSIS — IMO0002 Reserved for concepts with insufficient information to code with codable children: Secondary | ICD-10-CM | POA: Insufficient documentation

## 2013-02-08 DIAGNOSIS — S0003XA Contusion of scalp, initial encounter: Secondary | ICD-10-CM | POA: Insufficient documentation

## 2013-02-08 DIAGNOSIS — Y92009 Unspecified place in unspecified non-institutional (private) residence as the place of occurrence of the external cause: Secondary | ICD-10-CM | POA: Insufficient documentation

## 2013-02-08 DIAGNOSIS — Z8639 Personal history of other endocrine, nutritional and metabolic disease: Secondary | ICD-10-CM | POA: Insufficient documentation

## 2013-02-08 DIAGNOSIS — Z79899 Other long term (current) drug therapy: Secondary | ICD-10-CM | POA: Insufficient documentation

## 2013-02-08 DIAGNOSIS — J45909 Unspecified asthma, uncomplicated: Secondary | ICD-10-CM | POA: Insufficient documentation

## 2013-02-08 DIAGNOSIS — W108XXA Fall (on) (from) other stairs and steps, initial encounter: Secondary | ICD-10-CM | POA: Insufficient documentation

## 2013-02-08 DIAGNOSIS — Y9389 Activity, other specified: Secondary | ICD-10-CM | POA: Insufficient documentation

## 2013-02-08 DIAGNOSIS — F909 Attention-deficit hyperactivity disorder, unspecified type: Secondary | ICD-10-CM | POA: Insufficient documentation

## 2013-02-08 DIAGNOSIS — Z765 Malingerer [conscious simulation]: Secondary | ICD-10-CM | POA: Insufficient documentation

## 2013-02-08 NOTE — ED Notes (Signed)
Pt states she fell down approx 16 metal steps at 9 pm, states she slid down most of the way and then landed on her buttocks/tailbone and hit her back and back of her head.

## 2013-02-09 ENCOUNTER — Emergency Department (HOSPITAL_COMMUNITY)
Admission: EM | Admit: 2013-02-09 | Discharge: 2013-02-09 | Disposition: A | Discharge status: Home / Self Care | Payer: Medicaid Other | Attending: Emergency Medicine | Admitting: Emergency Medicine

## 2013-02-09 ENCOUNTER — Encounter (HOSPITAL_COMMUNITY): Payer: Self-pay

## 2013-02-09 ENCOUNTER — Emergency Department (HOSPITAL_COMMUNITY): Payer: Medicaid Other

## 2013-02-09 DIAGNOSIS — S0093XA Contusion of unspecified part of head, initial encounter: Secondary | ICD-10-CM

## 2013-02-09 DIAGNOSIS — M533 Sacrococcygeal disorders, not elsewhere classified: Secondary | ICD-10-CM

## 2013-02-09 DIAGNOSIS — Z765 Malingerer [conscious simulation]: Secondary | ICD-10-CM

## 2013-02-09 DIAGNOSIS — M542 Cervicalgia: Secondary | ICD-10-CM

## 2013-02-09 DIAGNOSIS — W108XXA Fall (on) (from) other stairs and steps, initial encounter: Secondary | ICD-10-CM

## 2013-02-09 DIAGNOSIS — M549 Dorsalgia, unspecified: Secondary | ICD-10-CM

## 2013-02-09 MED ORDER — KETOROLAC TROMETHAMINE 60 MG/2ML IM SOLN
60.0000 mg | Freq: Once | INTRAMUSCULAR | Status: AC
  Administered 2013-02-09: 60 mg via INTRAMUSCULAR
  Filled 2013-02-09: qty 2

## 2013-02-09 MED ORDER — CYCLOBENZAPRINE HCL 10 MG PO TABS: 10.0000 mg | ORAL_TABLET | Freq: Three times a day (TID) | ORAL | Status: DC | PRN

## 2013-02-09 MED ORDER — PREDNISONE 20 MG PO TABS: ORAL_TABLET | ORAL | Status: DC

## 2013-02-09 MED ORDER — DIAZEPAM 5 MG/ML IJ SOLN
5.0000 mg | Freq: Once | INTRAMUSCULAR | Status: AC
  Administered 2013-02-09: 5 mg via INTRAVENOUS
  Filled 2013-02-09: qty 2

## 2013-02-09 MED ORDER — MORPHINE SULFATE 4 MG/ML IJ SOLN
4.0000 mg | Freq: Once | INTRAMUSCULAR | Status: AC
  Administered 2013-02-09: 4 mg via INTRAMUSCULAR
  Filled 2013-02-09: qty 1

## 2013-02-09 MED ORDER — ONDANSETRON 4 MG PO TBDP
4.0000 mg | ORAL_TABLET | Freq: Once | ORAL | Status: AC
  Administered 2013-02-09: 4 mg via ORAL
  Filled 2013-02-09: qty 1

## 2013-02-09 NOTE — ED Provider Notes (Signed)
History     CSN: [id number]  Arrival date & time 02/08/13  2300   First MD Initiated Contact with Patient 02/09/13 0053      Chief Complaint  Patient presents with  . Fall    (Consider location/radiation/quality/duration/timing/severity/associated sxs/prior treatment) HPI  Patient reports she was trying to chase her cat off the balcony and she was wearing socks on her metal stairs and she slipped from the top of the stairs all the way down to the ground which was 16 steps. She does not think she had loss of consciousness because she she was aware of what was happening however she states she saw black. She states she has some pain in her head diffusely maybe more on the left side. She complains of pain from her neck all the way down her spine to her tailbone. She states if she sits up it causes pain in her right leg. She denies any numbness. She denies any pain in her chest, arms, or her abdomen. She denies nausea, vomiting, or change in vision.  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 She states she's never had meningitis.  PCP NP Arlyn Leak in [place]  Past Medical History  Diagnosis Date  . Borderline diabetes   . Fibromyalgia   . ADHD (attention deficit hyperactivity disorder)   . Asthma   . Bipolar 1 disorder   . DDD (degenerative disc disease), lumbar     Past Surgical History  Procedure Laterality Date  . Cholecystectomy      No family history on file.  History  Substance Use Topics  . Smoking status: Former Games developer  . Smokeless tobacco: Not on file  . Alcohol Use: No     Comment: occ   on disability for bipolar disorder  OB History   Grav Para Term Preterm Abortions TAB SAB Ect Mult Living                  Review of Systems  All other systems reviewed and are negative.    Allergies  Peanut butter flavor; Amoxicillin; Bactrim; Penicillins; Ceclor; and Latex  Home Medications   Current Outpatient Rx  Name   Route  Sig  Dispense  Refill  . ARIPiprazole (ABILIFY) 15 MG tablet   Oral   Take 15 mg by mouth daily.         . cetirizine (ZYRTEC) 10 MG tablet   Oral   Take 10 mg by mouth daily.         . cholecalciferol (VITAMIN D-400) 400 UNITS TABS   Oral   Take 400 Units by mouth daily.         . diclofenac sodium (VOLTAREN) 1 % GEL   Topical   Apply 1 application topically daily as needed.         . DULoxetine (CYMBALTA) 60 MG capsule   Oral   Take 60 mg by mouth daily.         . fluticasone (FLONASE) 50 MCG/ACT nasal spray   Nasal   Place 2 sprays into the nose daily as needed. For allergies and congestion         . HYDROcodone-acetaminophen (NORCO/VICODIN) 5-325 MG per tablet      One tab po q 4-6 hrs prn pain   20 tablet   0   . hydrOXYzine (ATARAX/VISTARIL) 50 MG tablet   Oral   Take 50-100 mg by mouth 2 (two) times daily. Take  one tablet in the morning and two tablets at bedtime         . ibuprofen (ADVIL,MOTRIN) 800 MG tablet   Oral   Take 800 mg by mouth 3 (three) times daily as needed.         Marland Kitchen levothyroxine (SYNTHROID, LEVOTHROID) 75 MCG tablet   Oral   Take 75 mcg by mouth daily.           . Menthol, Topical Analgesic, (ICY HOT BACK EX)   Apply externally   Apply 1 patch topically 2 (two) times daily.           Marland Kitchen omeprazole (PRILOSEC) 20 MG capsule   Oral   Take 20 mg by mouth every morning.         . pregabalin (LYRICA) 100 MG capsule   Oral   Take 100 mg by mouth 3 (three) times daily.         Marland Kitchen tiZANidine (ZANAFLEX) 4 MG tablet   Oral   Take 4 mg by mouth every 8 (eight) hours as needed.           BP 147/67  Pulse 87  Temp(Src) 98 F (36.7 C) (Oral)  Resp 18  Ht 5' (1.524 m)  Wt 175 lb (79.379 kg)  BMI 34.18 kg/m2  SpO2 98%  LMP 02/08/2013  Vital signs normal    Physical Exam  Nursing note and vitals reviewed. Constitutional: She is oriented to person, place, and time. She appears well-developed and  well-nourished.  Non-toxic appearance. She does not appear ill. No distress.  HENT:  Head: Normocephalic.  Right Ear: External ear normal.  Left Ear: External ear normal.  Nose: Nose normal. No mucosal edema or rhinorrhea.  Mouth/Throat: Oropharynx is clear and moist and mucous membranes are normal. No dental abscesses or edematous.  Head tender diffusely without localization, no swelling, abrasions or lacerations seen  Eyes: Conjunctivae and EOM are normal. Pupils are equal, round, and reactive to light.  Neck: Full passive range of motion without pain.  C-collar in place  Cardiovascular: Normal rate, regular rhythm and normal heart sounds.  Exam reveals no gallop and no friction rub.   No murmur heard. Pulmonary/Chest: Effort normal and breath sounds normal. No respiratory distress. She has no wheezes. She has no rhonchi. She has no rales. She exhibits tenderness. She exhibits no crepitus.  Although patient denies pain in her chest when I palpate her chest wall she states it hurts on both sides.  Abdominal: Soft. Normal appearance and bowel sounds are normal. She exhibits no distension. There is no tenderness. There is no rebound and no guarding.  Musculoskeletal: Normal range of motion. She exhibits no edema and no tenderness.  Moves all extremities well. Patient is tender diffusely from her thoracic, lumbar, and sacral and coccygeal spine, there is no localization of pain.  Neurological: She is alert and oriented to person, place, and time. She has normal strength. No cranial nerve deficit.  Skin: Skin is warm, dry and intact. No rash noted. No erythema. No pallor.  Patient has no bruising, abrasions or contusions  Psychiatric: She has a normal mood and affect. Her speech is normal and behavior is normal. Her mood appears not anxious.    ED Course  Procedures (including critical care time)  Medications  morphine 4 MG/ML injection 4 mg (4 mg Intramuscular Given 02/09/13 0230)   ketorolac (TORADOL) injection 60 mg (60 mg Intramuscular Given 02/09/13 0233)  diazepam (VALIUM) injection 5 mg (5  mg Intravenous Given 02/09/13 0234)  ondansetron (ZOFRAN-ODT) disintegrating tablet 4 mg (4 mg Oral Given 02/09/13 4098)    I have reviewed the [place] controlled substance site and patient gets #60 hydrocodone 5/325 on a monthly basis from [place], the last was 4/6 then 5/12, so she should have enough until the first week of June, states she has run out. Advised she would need to ask her PCP for more pain medication and she was given the chronic pain discharge statement.   Dg Chest 2 View  02/09/2013   *RADIOLOGY REPORT*  Clinical Data: Status post fall down 16 metal steps; hit back. Concern for chest injury.  CHEST - 2 VIEW  Comparison: None  Findings: The lungs are well-aerated and clear.  There is no evidence of focal opacification, pleural effusion or pneumothorax.  The heart is normal in size; the mediastinal contour is within normal limits.  No acute osseous abnormalities are seen.  Clips are noted within the right upper quadrant, reflecting prior cholecystectomy.  IMPRESSION: No acute cardiopulmonary process seen; no displaced rib fractures identified.   Original Report Authenticated By: Tonia Ghent, M.D.   Dg Cervical Spine Complete  02/09/2013   *RADIOLOGY REPORT*  Clinical Data: Status post fall down 16 metal steps; hit back of head.  Concern for neck injury.  CERVICAL SPINE - COMPLETE 4+ VIEW  Comparison: None.  Findings: There is no evidence of fracture or subluxation. Vertebral bodies demonstrate normal height and alignment. Intervertebral disc spaces are preserved.  Prevertebral soft tissues are within normal limits.  The provided odontoid view demonstrates no significant abnormality.  The visualized lung apices are clear.  IMPRESSION: No evidence of fracture or subluxation along the cervical spine.   Original Report Authenticated By: Tonia Ghent, M.D.   Dg  Thoracic Spine 2 View  02/09/2013   *RADIOLOGY REPORT*  Clinical Data: Larey Seat down 16 metal steps; hit back.  Concern for upper back injury.  THORACIC SPINE - 2 VIEW  Comparison: None  Findings: There is no evidence of fracture or subluxation. Vertebral bodies demonstrate normal height and alignment. Intervertebral disc spaces are preserved.  The visualized portions of both lungs are clear.  The mediastinum is unremarkable in appearance.  Clips are noted within the right upper quadrant, reflecting prior cholecystectomy.  IMPRESSION: No evidence of fracture or subluxation along the thoracic spine.   Original Report Authenticated By: Tonia Ghent, M.D.   Dg Lumbar Spine Complete  02/09/2013   *RADIOLOGY REPORT*  Clinical Data: Status post fall down 16 metal steps; hit back. Lower back pain.  LUMBAR SPINE - COMPLETE 4+ VIEW  Comparison: Lumbar spine radiographs performed 08/06/2012  Findings: There is no evidence of fracture or subluxation. Vertebral bodies demonstrate normal height and alignment. Intervertebral disc spaces are preserved.  The visualized neural foramina are grossly unremarkable in appearance.  The visualized bowel gas pattern is unremarkable in appearance; air and stool are noted within the colon.  The sacroiliac joints are within normal limits.  Clips are noted within the right upper quadrant, reflecting prior cholecystectomy.  IMPRESSION: No evidence of fracture or subluxation along the lumbar spine.   Original Report Authenticated By: Tonia Ghent, M.D.   Dg Sacrum/coccyx  02/09/2013   *RADIOLOGY REPORT*  Clinical Data: Status post fall down 16 metal steps; landed on tailbone.  Concern for sacrococcygeal injury.  SACRUM AND COCCYX - 2+ VIEW  Comparison: Lumbar spine radiographs performed 08/06/2012, and CT of the abdomen and pelvis from 11/14/2011  Findings: There  is no evidence of fracture or dislocation.  The sacrum and coccyx appear intact; the sacroiliac joints are unremarkable in  appearance.  Minimal irregularity is again noted involving the anterior superior endplate of L5, likely developmental in nature.  The visualized bowel gas pattern is grossly unremarkable.  IMPRESSION: No evidence of fracture or dislocation.   Original Report Authenticated By: Tonia Ghent, M.D.   Ct Head Wo Contrast  02/09/2013   *RADIOLOGY REPORT*  Clinical Data: Larey Seat down 16 metal steps; hit back of head.  Left posterior head pain and bilateral temporal pain.  CT HEAD WITHOUT CONTRAST  Technique:  Contiguous axial images were obtained from the base of the skull through the vertex without contrast.  Comparison: MRI of the brain performed 05/29/2011  Findings: There is no evidence of acute infarction, mass lesion, or intra- or extra-axial hemorrhage on CT.  The posterior fossa, including the cerebellum, brainstem and fourth ventricle, is within normal limits.  The third and lateral ventricles, and basal ganglia are unremarkable in appearance.  The cerebral hemispheres are symmetric in appearance, with normal gray- white differentiation.  No mass effect or midline shift is seen.  There is no evidence of fracture; visualized osseous structures are unremarkable in appearance.  The orbits are within normal limits. The paranasal sinuses and mastoid air cells are well-aerated.  No significant soft tissue abnormalities are seen.  IMPRESSION: No evidence of traumatic intracranial injury or fracture.   Original Report Authenticated By: Tonia Ghent, M.D.     1. Fall down stairs, initial encounter   2. Neck pain, acute   3. Back pain   4. Head contusion, initial encounter   5. Coccygeal pain, acute   6. Drug-seeking behavior     Discharge Medication List as of 02/09/2013  4:06 AM    START taking these medications   Details  cyclobenzaprine (FLEXERIL) 10 MG tablet Take 1 tablet (10 mg total) by mouth 3 (three) times daily as needed for muscle spasms., Starting 02/09/2013, Until Discontinued, Print     predniSONE (DELTASONE) 20 MG tablet Take 3 po QD x 3d , then 2 po QD x 3d then 1 po QD x 3d, Print         Plan discharge  Devoria Albe, MD, Armando Gang   MDM          Ward Givens, MD 02/09/13 [phone]

## 2013-02-09 NOTE — ED Notes (Signed)
Now c/o nausea. "my body doesn't like the xray machine"

## 2013-02-09 NOTE — ED Notes (Signed)
Observed ambulating to bathroom with ease.

## 2013-03-07 ENCOUNTER — Ambulatory Visit: Payer: Medicaid Other

## 2013-03-07 ENCOUNTER — Encounter: Payer: Self-pay | Admitting: *Deleted

## 2013-03-08 ENCOUNTER — Encounter: Payer: Self-pay | Admitting: Adult Health

## 2013-03-08 ENCOUNTER — Ambulatory Visit (INDEPENDENT_AMBULATORY_CARE_PROVIDER_SITE_OTHER): Payer: Medicaid Other | Admitting: Adult Health

## 2013-03-08 VITALS — BP 134/86 | Ht 60.0 in | Wt 189.0 lb

## 2013-03-08 DIAGNOSIS — Z3049 Encounter for surveillance of other contraceptives: Secondary | ICD-10-CM

## 2013-03-08 DIAGNOSIS — Z3202 Encounter for pregnancy test, result negative: Secondary | ICD-10-CM

## 2013-03-08 DIAGNOSIS — Z3200 Encounter for pregnancy test, result unknown: Secondary | ICD-10-CM

## 2013-03-08 DIAGNOSIS — Z309 Encounter for contraceptive management, unspecified: Secondary | ICD-10-CM

## 2013-03-08 LAB — POCT URINE PREGNANCY: Preg Test, Ur: NEGATIVE

## 2013-03-08 MED ORDER — MEDROXYPROGESTERONE ACETATE 150 MG/ML IM SUSP
150.0000 mg | Freq: Once | INTRAMUSCULAR | Status: AC
  Administered 2013-03-08: 150 mg via INTRAMUSCULAR

## 2013-05-31 ENCOUNTER — Ambulatory Visit: Payer: Medicaid Other

## 2014-02-17 ENCOUNTER — Other Ambulatory Visit (HOSPITAL_COMMUNITY): Payer: Self-pay

## 2014-02-17 DIAGNOSIS — G4730 Sleep apnea, unspecified: Secondary | ICD-10-CM

## 2014-02-20 ENCOUNTER — Ambulatory Visit: Payer: Medicaid Other | Attending: Neurology | Admitting: Sleep Medicine

## 2014-02-20 VITALS — Ht 60.0 in | Wt 176.0 lb

## 2014-02-20 DIAGNOSIS — Z79899 Other long term (current) drug therapy: Secondary | ICD-10-CM | POA: Diagnosis not present

## 2014-02-20 DIAGNOSIS — G4733 Obstructive sleep apnea (adult) (pediatric): Secondary | ICD-10-CM | POA: Diagnosis not present

## 2014-02-20 DIAGNOSIS — G4730 Sleep apnea, unspecified: Secondary | ICD-10-CM

## 2014-02-22 NOTE — Sleep Study (Signed)
[facility] Amber Henry A. Amber Pilgrim, MD     [email or web address]        NOCTURNAL POLYSOMNOGRAM    LOCATION: SLEEP LAB FACILITY: [facility]   PHYSICIAN: Bryana Froemming A. Amber Henry, M.D.   DATE OF STUDY: 02/20/2014.   REFERRING PHYSICIAN: Lisbeth Puller.  INDICATIONS: The patient is a 23 year old presents with snoring, insomnia and fatigue.  MEDICATIONS:  Prior to Admission medications   Medication Sig Start Date End Date Taking? Authorizing Provider  ARIPiprazole (ABILIFY) 15 MG tablet Take 15 mg by mouth daily.    Historical Provider, MD  cetirizine (ZYRTEC) 10 MG tablet Take 10 mg by mouth daily.    Historical Provider, MD  cholecalciferol (VITAMIN D-400) 400 UNITS TABS Take 400 Units by mouth daily.    Historical Provider, MD  cyclobenzaprine (FLEXERIL) 10 MG tablet Take 1 tablet (10 mg total) by mouth 3 (three) times daily as needed for muscle spasms. 02/09/13   Ward Givens, MD  diclofenac sodium (VOLTAREN) 1 % GEL Apply 1 application topically daily as needed.    Historical Provider, MD  DULoxetine (CYMBALTA) 60 MG capsule Take 60 mg by mouth daily.    Historical Provider, MD  fluticasone (FLONASE) 50 MCG/ACT nasal spray Place 2 sprays into the nose daily as needed. For allergies and congestion    Historical Provider, MD  HYDROcodone-acetaminophen (NORCO/VICODIN) 5-325 MG per tablet One tab po q 4-6 hrs prn pain 08/06/12   Evalina Field, PA-C  hydrOXYzine (ATARAX/VISTARIL) 50 MG tablet Take 50-100 mg by mouth 2 (two) times daily. Take one tablet in the morning and two tablets at bedtime    Historical Provider, MD  ibuprofen (ADVIL,MOTRIN) 800 MG tablet Take 800 mg by mouth 3 (three) times daily as needed.    Historical Provider, MD  levothyroxine (SYNTHROID, LEVOTHROID) 75 MCG tablet Take 75 mcg by mouth daily.      Historical Provider, MD  Menthol, Topical Analgesic, (ICY HOT BACK EX) Apply 1 patch topically 2 (two) times daily.      Historical Provider, MD  omeprazole (PRILOSEC) 20  MG capsule Take 20 mg by mouth every morning.    Historical Provider, MD  predniSONE (DELTASONE) 20 MG tablet Take 3 po QD x 3d , then 2 po QD x 3d then 1 po QD x 3d 02/09/13   Ward Givens, MD  pregabalin (LYRICA) 100 MG capsule Take 100 mg by mouth 3 (three) times daily.    Historical Provider, MD  tiZANidine (ZANAFLEX) 4 MG tablet Take 4 mg by mouth every 8 (eight) hours as needed.    Historical Provider, MD  traMADol (ULTRAM) 50 MG tablet Take 50-100 mg by mouth every 6 (six) hours as needed.    Historical Provider, MD      EPWORTH SLEEPINESS SCALE: 8.   BMI: 37.   ARCHITECTURAL SUMMARY: Total recording time was 390 minutes. Sleep efficiency 95 %. Sleep latency 11 minutes. REM latency 78 minutes. Stage NI 1 %, N2 58 % and N3 25 % and REM sleep 16 %.    RESPIRATORY DATA:  Baseline oxygen saturation is 90 %. The lowest saturation is 80 %. The diagnostic AHI is 8. The RDI is 8. The REM AHI is 21.  LIMB MOVEMENT SUMMARY: PLM index 0.   ELECTROCARDIOGRAM SUMMARY: Average heart rate is 78 with no significant dysrhythmias observed.   IMPRESSION:  1.  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  Thanks for this referral.  Richey Doolittle A. Amber Henry, M.D. Diplomat, Biomedical engineer of  Sleep Medicine.

## 2015-03-28 ENCOUNTER — Encounter: Payer: Self-pay | Admitting: Internal Medicine

## 2016-05-12 ENCOUNTER — Encounter (HOSPITAL_COMMUNITY): Payer: Self-pay | Admitting: Emergency Medicine

## 2016-05-12 ENCOUNTER — Emergency Department (HOSPITAL_COMMUNITY)
Admission: EM | Admit: 2016-05-12 | Discharge: 2016-05-12 | Disposition: A | Discharge status: Home / Self Care | Payer: Medicaid Other | Attending: Emergency Medicine | Admitting: Emergency Medicine

## 2016-05-12 ENCOUNTER — Emergency Department (HOSPITAL_COMMUNITY): Payer: Medicaid Other

## 2016-05-12 DIAGNOSIS — Y929 Unspecified place or not applicable: Secondary | ICD-10-CM | POA: Diagnosis not present

## 2016-05-12 DIAGNOSIS — J45909 Unspecified asthma, uncomplicated: Secondary | ICD-10-CM | POA: Diagnosis not present

## 2016-05-12 DIAGNOSIS — Y999 Unspecified external cause status: Secondary | ICD-10-CM | POA: Diagnosis not present

## 2016-05-12 DIAGNOSIS — S93402A Sprain of unspecified ligament of left ankle, initial encounter: Secondary | ICD-10-CM | POA: Diagnosis not present

## 2016-05-12 DIAGNOSIS — Z791 Long term (current) use of non-steroidal anti-inflammatories (NSAID): Secondary | ICD-10-CM | POA: Insufficient documentation

## 2016-05-12 DIAGNOSIS — Y939 Activity, unspecified: Secondary | ICD-10-CM | POA: Diagnosis not present

## 2016-05-12 DIAGNOSIS — W01198A Fall on same level from slipping, tripping and stumbling with subsequent striking against other object, initial encounter: Secondary | ICD-10-CM | POA: Diagnosis not present

## 2016-05-12 DIAGNOSIS — F909 Attention-deficit hyperactivity disorder, unspecified type: Secondary | ICD-10-CM | POA: Insufficient documentation

## 2016-05-12 DIAGNOSIS — Z87891 Personal history of nicotine dependence: Secondary | ICD-10-CM | POA: Diagnosis not present

## 2016-05-12 DIAGNOSIS — S99912A Unspecified injury of left ankle, initial encounter: Secondary | ICD-10-CM | POA: Diagnosis present

## 2016-05-12 NOTE — ED Provider Notes (Signed)
[facility]-EMERGENCY DEPT Provider Note   CSN: [id number] Arrival date & time: 05/12/16  1208   By signing my name below, I, Christel Mormon, attest that this documentation has been prepared under the direction and in the presence of Langston Masker, [place]. Electronically Signed: Christel Mormon, Scribe. 05/12/2016. 3:24 PM.   History   Chief Complaint Chief Complaint  Patient presents with  . Ankle Pain   The history is provided by the patient. No language interpreter was used.   HPI Comments:  Amber Henry is a 25 y.o. female who presents to the Emergency Department complaining of L ankle pain s/p a fall at ~12PM today. Pt reports tripping over her shoes and falling on concrete. Pt is not currently taking any medications.   Past Medical History:  Diagnosis Date  . ADHD (attention deficit hyperactivity disorder)   . Anxiety   . Asthma   . Bipolar 1 disorder (HCC)   . Borderline diabetes   . DDD (degenerative disc disease), lumbar   . Depression   . Fibromyalgia     Patient Active Problem List   Diagnosis Date Noted  . ILEUS 02/20/2010  . NAUSEA AND VOMITING 02/15/2010  . ABDOMINAL PAIN, UPPER 02/15/2010  . TRANSAMINASES, SERUM, ELEVATED 02/15/2010    Past Surgical History:  Procedure Laterality Date  . CHOLECYSTECTOMY      OB History    No data available       Home Medications    Prior to Admission medications   Medication Sig Start Date End Date Taking? Authorizing Provider  ARIPiprazole (ABILIFY) 15 MG tablet Take 15 mg by mouth daily.    Historical Provider, MD  cetirizine (ZYRTEC) 10 MG tablet Take 10 mg by mouth daily.    Historical Provider, MD  cholecalciferol (VITAMIN D-400) 400 UNITS TABS Take 400 Units by mouth daily.    Historical Provider, MD  cyclobenzaprine (FLEXERIL) 10 MG tablet Take 1 tablet (10 mg total) by mouth 3 (three) times daily as needed for muscle spasms. 02/09/13   Devoria Albe, MD  diclofenac sodium (VOLTAREN) 1 % GEL Apply 1 application  topically daily as needed.    Historical Provider, MD  DULoxetine (CYMBALTA) 60 MG capsule Take 60 mg by mouth daily.    Historical Provider, MD  fluticasone (FLONASE) 50 MCG/ACT nasal spray Place 2 sprays into the nose daily as needed. For allergies and congestion    Historical Provider, MD  HYDROcodone-acetaminophen (NORCO/VICODIN) 5-325 MG per tablet One tab po q 4-6 hrs prn pain 08/06/12   Worthy Rancher, PA-C  hydrOXYzine (ATARAX/VISTARIL) 50 MG tablet Take 50-100 mg by mouth 2 (two) times daily. Take one tablet in the morning and two tablets at bedtime    Historical Provider, MD  ibuprofen (ADVIL,MOTRIN) 800 MG tablet Take 800 mg by mouth 3 (three) times daily as needed.    Historical Provider, MD  levothyroxine (SYNTHROID, LEVOTHROID) 75 MCG tablet Take 75 mcg by mouth daily.      Historical Provider, MD  Menthol, Topical Analgesic, (ICY HOT BACK EX) Apply 1 patch topically 2 (two) times daily.      Historical Provider, MD  omeprazole (PRILOSEC) 20 MG capsule Take 20 mg by mouth every morning.    Historical Provider, MD  predniSONE (DELTASONE) 20 MG tablet Take 3 po QD x 3d , then 2 po QD x 3d then 1 po QD x 3d 02/09/13   Devoria Albe, MD  pregabalin (LYRICA) 100 MG capsule Take 100 mg by mouth 3 (  three) times daily.    Historical Provider, MD  tiZANidine (ZANAFLEX) 4 MG tablet Take 4 mg by mouth every 8 (eight) hours as needed.    Historical Provider, MD  traMADol (ULTRAM) 50 MG tablet Take 50-100 mg by mouth every 6 (six) hours as needed.    Historical Provider, MD    Family History Family History  Problem Relation Age of Onset  . Cancer Mother     breast  . Kidney disease Mother     kidney failure  . Hypertension Other   . Heart disease Other   . Diabetes Other     Social History Social History  Substance Use Topics  . Smoking status: Former Games developer  . Smokeless tobacco: Never Used  . Alcohol use No     Comment: occ     Allergies   Peanut butter flavor; Amoxicillin;  Bactrim; Penicillins; Ceclor [cefaclor]; and Latex   Review of Systems Review of Systems  Constitutional: Negative for fever.  Musculoskeletal: Positive for arthralgias and joint swelling.  All other systems reviewed and are negative.    Physical Exam Updated Vital Signs BP 130/74 (BP Location: Left Arm)   Pulse 82   Temp 98.6 F (37 C) (Oral)   Resp 16   Ht 5' (1.524 m)   Wt 178 lb (80.7 kg)   SpO2 100%   BMI 34.76 kg/m   Physical Exam  Constitutional: She appears well-developed and well-nourished. No distress.  HENT:  Head: Normocephalic and atraumatic.  Eyes: Conjunctivae are normal.  Cardiovascular: Normal rate.   Pulmonary/Chest: Effort normal.  Abdominal: She exhibits no distension.  Musculoskeletal: She exhibits edema.  Neurological: She is alert.  Skin: Skin is warm and dry.  Psychiatric: She has a normal mood and affect.  Nursing note and vitals reviewed.    ED Treatments / Results  DIAGNOSTIC STUDIES:  Oxygen Saturation is 100% on RA, normal by my interpretation.    COORDINATION OF CARE:  3:24 PM Discussed treatment plan with pt at bedside and pt agreed to plan.  Labs (all labs ordered are listed, but only abnormal results are displayed) Labs Reviewed - No data to display  EKG  EKG Interpretation None       Radiology Dg Ankle Complete Left  Result Date: 05/12/2016 CLINICAL DATA:  Fall today, twisted left ankle. Lateral pain and swelling. EXAM: LEFT ANKLE COMPLETE - 3+ VIEW COMPARISON:  None. FINDINGS: Lateral soft tissue swelling. No underlying bony abnormality. No fracture, subluxation or dislocation. IMPRESSION: Lateral soft tissue swelling.  No acute bony abnormality. Electronically Signed   By: Charlett Nose M.D.   On: 05/12/2016 12:56    Procedures Procedures (including critical care time)  Medications Ordered in ED Medications - No data to display   Initial Impression / Assessment and Plan / ED Course  I have reviewed the triage  vital signs and the nursing notes.  Pertinent labs & imaging results that were available during my care of the patient were reviewed by me and considered in my medical decision making (see chart for details).  Clinical Course      Final Clinical Impressions(s) / ED Diagnoses   Final diagnoses:  Ankle sprain, left, initial encounter    New Prescriptions New Prescriptions   No medications on file  aso Ibuprofen cruthches  I personally performed the services in this documentation, which was scribed in my presence.  The recorded information has been reviewed and considered.   Barnet Pall.   Elson Areas,  PA-C 05/12/16 1531    Bethann BerkshireJoseph Zammit, MD 05/17/16 [phone]

## 2016-05-12 NOTE — ED Notes (Signed)
ASO applied to left ankle

## 2016-05-12 NOTE — ED Notes (Signed)
Ice pack given to pt.

## 2016-05-12 NOTE — ED Triage Notes (Signed)
Pt reports falling today after tripping and having left ankle pain, pt has swelling to posterior left ankle.  CMS intact.  Pt fell onto concrete.  Pt denies head injury or loc.

## 2016-06-29 ENCOUNTER — Encounter (HOSPITAL_COMMUNITY): Payer: Self-pay

## 2016-06-29 DIAGNOSIS — Z87891 Personal history of nicotine dependence: Secondary | ICD-10-CM | POA: Insufficient documentation

## 2016-06-29 DIAGNOSIS — J45909 Unspecified asthma, uncomplicated: Secondary | ICD-10-CM | POA: Insufficient documentation

## 2016-06-29 DIAGNOSIS — L600 Ingrowing nail: Secondary | ICD-10-CM | POA: Diagnosis not present

## 2016-06-29 DIAGNOSIS — Z5321 Procedure and treatment not carried out due to patient leaving prior to being seen by health care provider: Secondary | ICD-10-CM | POA: Insufficient documentation

## 2016-06-29 DIAGNOSIS — F909 Attention-deficit hyperactivity disorder, unspecified type: Secondary | ICD-10-CM | POA: Insufficient documentation

## 2016-06-29 NOTE — ED Triage Notes (Signed)
Reports of ingrown toe nail x2 weeks to left great toe.

## 2016-06-30 ENCOUNTER — Emergency Department (HOSPITAL_COMMUNITY)
Admission: EM | Admit: 2016-06-30 | Discharge: 2016-06-30 | Disposition: A | Discharge status: Home / Self Care | Payer: Medicaid Other | Attending: Dermatology | Admitting: Dermatology

## 2016-11-11 ENCOUNTER — Emergency Department (HOSPITAL_COMMUNITY)
Admission: EM | Admit: 2016-11-11 | Discharge: 2016-11-11 | Disposition: A | Discharge status: Home / Self Care | Payer: Medicaid Other | Attending: Emergency Medicine | Admitting: Emergency Medicine

## 2016-11-11 ENCOUNTER — Emergency Department (HOSPITAL_COMMUNITY): Payer: Medicaid Other

## 2016-11-11 ENCOUNTER — Encounter (HOSPITAL_COMMUNITY): Payer: Self-pay | Admitting: Emergency Medicine

## 2016-11-11 DIAGNOSIS — W010XXA Fall on same level from slipping, tripping and stumbling without subsequent striking against object, initial encounter: Secondary | ICD-10-CM | POA: Insufficient documentation

## 2016-11-11 DIAGNOSIS — Y939 Activity, unspecified: Secondary | ICD-10-CM | POA: Diagnosis not present

## 2016-11-11 DIAGNOSIS — S93402A Sprain of unspecified ligament of left ankle, initial encounter: Secondary | ICD-10-CM | POA: Diagnosis not present

## 2016-11-11 DIAGNOSIS — F909 Attention-deficit hyperactivity disorder, unspecified type: Secondary | ICD-10-CM | POA: Insufficient documentation

## 2016-11-11 DIAGNOSIS — Z79899 Other long term (current) drug therapy: Secondary | ICD-10-CM | POA: Insufficient documentation

## 2016-11-11 DIAGNOSIS — Y929 Unspecified place or not applicable: Secondary | ICD-10-CM | POA: Insufficient documentation

## 2016-11-11 DIAGNOSIS — J45909 Unspecified asthma, uncomplicated: Secondary | ICD-10-CM | POA: Diagnosis not present

## 2016-11-11 DIAGNOSIS — F17200 Nicotine dependence, unspecified, uncomplicated: Secondary | ICD-10-CM | POA: Insufficient documentation

## 2016-11-11 DIAGNOSIS — Y999 Unspecified external cause status: Secondary | ICD-10-CM | POA: Diagnosis not present

## 2016-11-11 DIAGNOSIS — S99912A Unspecified injury of left ankle, initial encounter: Secondary | ICD-10-CM | POA: Diagnosis present

## 2016-11-11 MED ORDER — OXYCODONE-ACETAMINOPHEN 5-325 MG PO TABS
1.0000 | ORAL_TABLET | Freq: Once | ORAL | Status: AC
  Administered 2016-11-11: 1 via ORAL
  Filled 2016-11-11: qty 1

## 2016-11-11 MED ORDER — OXYCODONE-ACETAMINOPHEN 5-325 MG PO TABS: 1.0000 | ORAL_TABLET | Freq: Three times a day (TID) | ORAL | 0 refills | Status: DC | PRN

## 2016-11-11 MED ORDER — NAPROXEN 500 MG PO TABS: 500.0000 mg | ORAL_TABLET | Freq: Two times a day (BID) | ORAL | 0 refills | Status: DC

## 2016-11-11 NOTE — ED Notes (Signed)
Pt demonstrated correct use of crutches 

## 2016-11-11 NOTE — ED Provider Notes (Signed)
[facility]-EM[facility]Y DEPT Provider Note   CSN: [id number] Arrival date & time: 11/11/16  2021     History   Chief Complaint Chief Complaint  Patient presents with  . Ankle Injury    HPI Amber Henry is a 26 y.o. female presenting after a trip and fall where she twisted her ankle on the left. Denies any hit to the head or loss of consciousness. She reports swelling to the lateral malleolus and intense pain. She has a history of ankle fractures bilaterally and ankle sprains multiple times. She denies any other symptoms.  HPI  Past Medical History:  Diagnosis Date  . ADHD (attention deficit hyperactivity disorder)   . Anxiety   . Asthma   . Bipolar 1 disorder (HCC)   . Borderline diabetes   . DDD (degenerative disc disease), lumbar   . Depression   . Fibromyalgia     Patient Active Problem List   Diagnosis Date Noted  . ILEUS 02/20/2010  . NAUSEA AND VOMITING 02/15/2010  . ABDOMINAL PAIN, UPPER 02/15/2010  . TRANSAMINASES, SERUM, ELEVATED 02/15/2010    Past Surgical History:  Procedure Laterality Date  . CHOLECYSTECTOMY      OB History    No data available       Home Medications    Prior to Admission medications   Medication Sig Start Date End Date Taking? Authorizing Provider  ARIPiprazole (ABILIFY) 15 MG tablet Take 15 mg by mouth daily.    Historical Provider, MD  cetirizine (ZYRTEC) 10 MG tablet Take 10 mg by mouth daily.    Historical Provider, MD  cholecalciferol (VITAMIN D-400) 400 UNITS TABS Take 400 Units by mouth daily.    Historical Provider, MD  cyclobenzaprine (FLEXERIL) 10 MG tablet Take 1 tablet (10 mg total) by mouth 3 (three) times daily as needed for muscle spasms. 02/09/13   Devoria Albe, MD  diclofenac sodium (VOLTAREN) 1 % GEL Apply 1 application topically daily as needed.    Historical Provider, MD  DULoxetine (CYMBALTA) 60 MG capsule Take 60 mg by mouth daily.    Historical Provider, MD  fluticasone (FLONASE) 50 MCG/ACT nasal spray Place 2  sprays into the nose daily as needed. For allergies and congestion    Historical Provider, MD  HYDROcodone-acetaminophen (NORCO/VICODIN) 5-325 MG per tablet One tab po q 4-6 hrs prn pain 08/06/12   Worthy Rancher, PA-C  hydrOXYzine (ATARAX/VISTARIL) 50 MG tablet Take 50-100 mg by mouth 2 (two) times daily. Take one tablet in the morning and two tablets at bedtime    Historical Provider, MD  ibuprofen (ADVIL,MOTRIN) 800 MG tablet Take 800 mg by mouth 3 (three) times daily as needed.    Historical Provider, MD  levothyroxine (SYNTHROID, LEVOTHROID) 75 MCG tablet Take 75 mcg by mouth daily.      Historical Provider, MD  Menthol, Topical Analgesic, (ICY HOT BACK EX) Apply 1 patch topically 2 (two) times daily.      Historical Provider, MD  naproxen (NAPROSYN) 500 MG tablet Take 1 tablet (500 mg total) by mouth 2 (two) times daily with a meal. 11/11/16   Georgiana Shore, PA-C  omeprazole (PRILOSEC) 20 MG capsule Take 20 mg by mouth every morning.    Historical Provider, MD  oxyCODONE-acetaminophen (PERCOCET/ROXICET) 5-325 MG tablet Take 1 tablet by mouth every 8 (eight) hours as needed for severe pain. 11/11/16   Georgiana Shore, PA-C  predniSONE (DELTASONE) 20 MG tablet Take 3 po QD x 3d , then 2 po QD x  3d then 1 po QD x 3d 02/09/13   Devoria Albe, MD  pregabalin (LYRICA) 100 MG capsule Take 100 mg by mouth 3 (three) times daily.    Historical Provider, MD  tiZANidine (ZANAFLEX) 4 MG tablet Take 4 mg by mouth every 8 (eight) hours as needed.    Historical Provider, MD  traMADol (ULTRAM) 50 MG tablet Take 50-100 mg by mouth every 6 (six) hours as needed.    Historical Provider, MD    Family History Family History  Problem Relation Age of Onset  . Cancer Mother     breast  . Kidney disease Mother     kidney failure  . Hypertension Other   . Heart disease Other   . Diabetes Other     Social History Social History  Substance Use Topics  . Smoking status: Current Every Day Smoker  .  Smokeless tobacco: Never Used  . Alcohol use No     Comment: occ     Allergies   Peanut butter flavor; Amoxicillin; Bactrim; Penicillins; Ceclor [cefaclor]; and Latex   Review of Systems Review of Systems  Respiratory: Negative for shortness of breath.   Musculoskeletal: Positive for joint swelling.  Skin: Negative for color change, pallor and wound.  Neurological: Negative for dizziness, weakness and numbness.  All other systems reviewed and are negative.    Physical Exam Updated Vital Signs BP 125/63 (BP Location: Right Arm)   Pulse 88   Temp 98.3 F (36.8 C) (Oral)   Resp 18   Ht 5' (1.524 m)   Wt 80.3 kg   SpO2 100%   BMI 34.57 kg/m   Physical Exam  Constitutional: She appears well-developed and well-nourished. No distress.  Patient is afebrile, nontoxic-appearing, sitting comfortably in bed in no acute distress.  HENT:  Head: Normocephalic and atraumatic.  Neck: Normal range of motion. Neck supple.  Cardiovascular: Normal rate, regular rhythm, normal heart sounds and intact distal pulses.   Pulmonary/Chest: Effort normal and breath sounds normal. No respiratory distress.  Abdominal: There is tenderness.  Musculoskeletal: Normal range of motion. She exhibits edema and tenderness. She exhibits no deformity.  Patient with full range of motion of the ankle. Mild swelling noted at the lateral malleolus. Negative anterior drawer.  Patient is neurovascularly intact bilaterally.  Neurological: She is alert.  Skin: Skin is warm and dry. She is not diaphoretic.  Psychiatric: She has a normal mood and affect.  Nursing note and vitals reviewed.    ED Treatments / Results  Labs (all labs ordered are listed, but only abnormal results are displayed) Labs Reviewed - No data to display  EKG  EKG Interpretation None       Radiology Dg Ankle Complete Left  Result Date: 11/11/2016 CLINICAL DATA:  Left ankle pain after twisting injury just prior to arrival. EXAM:  LEFT ANKLE COMPLETE - 3+ VIEW COMPARISON:  None. FINDINGS: There is no evidence of fracture, dislocation, or joint effusion. There is no evidence of arthropathy or other focal bone abnormality. Anterior and lateral soft tissue edema. IMPRESSION: Anterior and lateral soft tissue edema.  No fracture or subluxation. Electronically Signed   By: Rubye Oaks M.D.   On: 11/11/2016 22:08    Procedures Procedures (including critical care time)  Medications Ordered in ED Medications  oxyCODONE-acetaminophen (PERCOCET/ROXICET) 5-325 MG per tablet 1 tablet (not administered)     Initial Impression / Assessment and Plan / ED Course  I have reviewed the triage vital signs and the nursing notes.  Pertinent labs & imaging results that were available during my care of the patient were reviewed by me and considered in my medical decision making (see chart for details).     Otherwise healthy 26 year old female presenting after ankle injury on the left. Reassuring exam, mild swelling over the lateral malleolus but joint is stable and she has full range of motion. Strong dorsalis pedis pulses bilaterally.  Pain was managed in the emergency department. X-ray showed no acute fractures or dislocations. Patient provided with ice and ankle brace and crutches. Discussed R.I.C.E protocol. Patient sates that she has crutches at home.  Discharge home with close orthopedic follow-up and symptomatic relief.  Discussed strict return precautions. Patient was advised to return to the emergency department if experiencing any new or worsening symptoms. Patient clearly understood instructions and agreed with discharge plan.   Final Clinical Impressions(s) / ED Diagnoses   Final diagnoses:  Sprain of left ankle, unspecified ligament, initial encounter    New Prescriptions New Prescriptions   NAPROXEN (NAPROSYN) 500 MG TABLET    Take 1 tablet (500 mg total) by mouth 2 (two) times daily with a meal.    OXYCODONE-ACETAMINOPHEN (PERCOCET/ROXICET) 5-325 MG TABLET    Take 1 tablet by mouth every 8 (eight) hours as needed for severe pain.     Georgiana Shore, PA-C 11/11/16 2321    Bethann Berkshire, MD 11/13/16 [phone]

## 2016-11-11 NOTE — Discharge Instructions (Signed)
As discussed, use naproxen for pain and swelling in combination with rest, ice, compress and elevation. Follow up with orthopedics Return to the emergency department if symptoms worsen.

## 2016-11-11 NOTE — ED Triage Notes (Signed)
Patient states she fell approximately 30 minutes ago twisting her left ankle.

## 2017-06-18 ENCOUNTER — Ambulatory Visit: Payer: Self-pay | Admitting: Orthopaedic Surgery

## 2017-12-03 LAB — HEMOGLOBIN A1C: Hemoglobin A1C: 13

## 2017-12-03 LAB — TSH: TSH: 2.79 (ref <=5.90)

## 2017-12-16 ENCOUNTER — Ambulatory Visit (HOSPITAL_COMMUNITY)
Admission: RE | Admit: 2017-12-16 | Discharge: 2017-12-16 | Disposition: A | Discharge status: Home / Self Care | Payer: Medicaid Other | Source: Ambulatory Visit | Attending: Neurology | Admitting: Neurology

## 2017-12-16 ENCOUNTER — Other Ambulatory Visit (HOSPITAL_COMMUNITY): Payer: Self-pay | Admitting: Neurology

## 2017-12-16 DIAGNOSIS — M47812 Spondylosis without myelopathy or radiculopathy, cervical region: Secondary | ICD-10-CM | POA: Diagnosis not present

## 2017-12-16 DIAGNOSIS — M542 Cervicalgia: Secondary | ICD-10-CM

## 2018-01-18 ENCOUNTER — Ambulatory Visit: Payer: Medicaid Other | Admitting: "Endocrinology

## 2018-01-18 ENCOUNTER — Encounter: Payer: Self-pay | Admitting: "Endocrinology

## 2018-01-18 VITALS — BP 120/80 | HR 91 | Ht 60.0 in | Wt 182.0 lb

## 2018-01-18 DIAGNOSIS — E66812 Obesity, class 2: Secondary | ICD-10-CM | POA: Insufficient documentation

## 2018-01-18 DIAGNOSIS — F17200 Nicotine dependence, unspecified, uncomplicated: Secondary | ICD-10-CM | POA: Insufficient documentation

## 2018-01-18 DIAGNOSIS — Z6835 Body mass index (BMI) 35.0-35.9, adult: Secondary | ICD-10-CM

## 2018-01-18 DIAGNOSIS — E039 Hypothyroidism, unspecified: Secondary | ICD-10-CM | POA: Insufficient documentation

## 2018-01-18 DIAGNOSIS — E1165 Type 2 diabetes mellitus with hyperglycemia: Secondary | ICD-10-CM | POA: Diagnosis not present

## 2018-01-18 MED ORDER — METFORMIN HCL 500 MG PO TABS: 500.0000 mg | ORAL_TABLET | Freq: Two times a day (BID) | ORAL | 2 refills | Status: DC

## 2018-01-18 NOTE — Patient Instructions (Signed)

## 2018-01-18 NOTE — Progress Notes (Signed)
Endocrinology Consult Note       01/18/2018, 4:49 PM   Subjective:    Patient ID: Amber Henry, female    DOB: 06/01/1991.  Amber Henry is being seen in consultation for management of currently uncontrolled symptomatic diabetes requested by  Avon Gully, MD.   Past Medical History:  Diagnosis Date  . ADHD (attention deficit hyperactivity disorder)   . Anxiety   . Asthma   . Bipolar 1 disorder (HCC)   . Borderline diabetes   . DDD (degenerative disc disease), lumbar   . Depression   . Fibromyalgia    Past Surgical History:  Procedure Laterality Date  . CHOLECYSTECTOMY     Social History   Socioeconomic History  . Marital status: Legally Separated    Spouse name: Not on file  . Number of children: Not on file  . Years of education: Not on file  . Highest education level: Not on file  Occupational History  . Not on file  Social Needs  . Financial resource strain: Not on file  . Food insecurity:    Worry: Not on file    Inability: Not on file  . Transportation needs:    Medical: Not on file    Non-medical: Not on file  Tobacco Use  . Smoking status: Current Every Day Smoker  . Smokeless tobacco: Never Used  Substance and Sexual Activity  . Alcohol use: No    Comment: occ  . Drug use: No  . Sexual activity: Yes    Birth control/protection: None  Lifestyle  . Physical activity:    Days per week: Not on file    Minutes per session: Not on file  . Stress: Not on file  Relationships  . Social connections:    Talks on phone: Not on file    Gets together: Not on file    Attends religious service: Not on file    Active member of club or organization: Not on file    Attends meetings of clubs or organizations: Not on file    Relationship status: Not on file  Other Topics Concern  . Not on file  Social History Narrative  . Not on file   Outpatient Encounter Medications  as of 01/18/2018  Medication Sig  . ARIPiprazole (ABILIFY) 15 MG tablet Take by mouth daily.  . cyclobenzaprine (FLEXERIL) 10 MG tablet Take 1 tablet (10 mg total) by mouth 3 (three) times daily as needed for muscle spasms.  . Insulin Glargine (LANTUS SOLOSTAR) 100 UNIT/ML Solostar Pen Inject 30 Units into the skin daily at 10 pm.  . levothyroxine (SYNTHROID, LEVOTHROID) 88 MCG tablet Take by mouth daily.  . pregabalin (LYRICA) 75 MG capsule Take by mouth 2 (two) times daily.  . metFORMIN (GLUCOPHAGE) 500 MG tablet Take 1 tablet (500 mg total) by mouth 2 (two) times daily with a meal.  . [DISCONTINUED] ARIPiprazole (ABILIFY) 15 MG tablet Take 15 mg by mouth daily.  . [DISCONTINUED] cetirizine (ZYRTEC) 10 MG tablet Take 10 mg by mouth daily.  . [DISCONTINUED] cholecalciferol (VITAMIN D-400) 400 UNITS TABS Take 400 Units by mouth daily.  . [  DISCONTINUED] diclofenac sodium (VOLTAREN) 1 % GEL Apply 1 application topically daily as needed.  . [DISCONTINUED] DULoxetine (CYMBALTA) 60 MG capsule Take 60 mg by mouth daily.  . [DISCONTINUED] fluticasone (FLONASE) 50 MCG/ACT nasal spray Place 2 sprays into the nose daily as needed. For allergies and congestion  . [DISCONTINUED] HYDROcodone-acetaminophen (NORCO/VICODIN) 5-325 MG per tablet One tab po q 4-6 hrs prn pain  . [DISCONTINUED] hydrOXYzine (ATARAX/VISTARIL) 50 MG tablet Take 50-100 mg by mouth 2 (two) times daily. Take one tablet in the morning and two tablets at bedtime  . [DISCONTINUED] ibuprofen (ADVIL,MOTRIN) 800 MG tablet Take 800 mg by mouth 3 (three) times daily as needed.  . [DISCONTINUED] levothyroxine (SYNTHROID, LEVOTHROID) 75 MCG tablet Take 75 mcg by mouth daily.    . [DISCONTINUED] Menthol, Topical Analgesic, (ICY HOT BACK EX) Apply 1 patch topically 2 (two) times daily.    . [DISCONTINUED] naproxen (NAPROSYN) 500 MG tablet Take 1 tablet (500 mg total) by mouth 2 (two) times daily with a meal.  . [DISCONTINUED] omeprazole (PRILOSEC) 20  MG capsule Take 20 mg by mouth every morning.  . [DISCONTINUED] oxyCODONE-acetaminophen (PERCOCET/ROXICET) 5-325 MG tablet Take 1 tablet by mouth every 8 (eight) hours as needed for severe pain.  . [DISCONTINUED] predniSONE (DELTASONE) 20 MG tablet Take 3 po QD x 3d , then 2 po QD x 3d then 1 po QD x 3d  . [DISCONTINUED] pregabalin (LYRICA) 100 MG capsule Take 100 mg by mouth 3 (three) times daily.  . [DISCONTINUED] tiZANidine (ZANAFLEX) 4 MG tablet Take 4 mg by mouth every 8 (eight) hours as needed.  . [DISCONTINUED] traMADol (ULTRAM) 50 MG tablet Take 50-100 mg by mouth every 6 (six) hours as needed.   No facility-administered encounter medications on file as of 01/18/2018.     ALLERGIES: Allergies  Allergen Reactions  . Peanut Butter Flavor Shortness Of Breath  . Amoxicillin Nausea And Vomiting  . Bactrim Hives  . Lithium   . Penicillins Nausea And Vomiting  . Ceclor [Cefaclor] Rash  . Latex Rash    Any latex over 5%    VACCINATION STATUS:  There is no immunization history on file for this patient.  Diabetes  She presents for her initial diabetic visit. She has type 2 diabetes mellitus. Disease course: She was diagnosed with type 2 diabetes at age 27, with A1c of 13%. There are no hypoglycemic associated symptoms. Pertinent negatives for hypoglycemia include no confusion, headaches, pallor or seizures. Associated symptoms include fatigue, polydipsia and polyuria. Pertinent negatives for diabetes include no chest pain and no polyphagia. There are no hypoglycemic complications. Symptoms are worsening. There are no diabetic complications. Risk factors for coronary artery disease include diabetes mellitus, sedentary lifestyle, tobacco exposure, obesity and family history. Current diabetic treatment includes insulin injections (She was initiated on Lantus 20 units nightly.). Her weight is fluctuating minimally. She is following a generally unhealthy diet. When asked about meal planning, she  reported none. She has not had a previous visit with a dietitian. She never participates in exercise. (She is not monitoring blood glucose.  She did not bring any meter nor logs to review.  Her recent A1c was 13% on December 03, 2017.) An ACE inhibitor/angiotensin II receptor blocker is not being taken. She does not see a podiatrist.Eye exam is not current.      Review of Systems  Constitutional: Positive for fatigue. Negative for chills, fever and unexpected weight change.  HENT: Negative for trouble swallowing and voice change.  Eyes: Negative for visual disturbance.  Respiratory: Negative for cough, shortness of breath and wheezing.   Cardiovascular: Negative for chest pain, palpitations and leg swelling.  Gastrointestinal: Negative for diarrhea, nausea and vomiting.  Endocrine: Positive for polydipsia and polyuria. Negative for cold intolerance, heat intolerance and polyphagia.  Musculoskeletal: Negative for arthralgias and myalgias.  Skin: Negative for color change, pallor, rash and wound.  Neurological: Negative for seizures and headaches.  Psychiatric/Behavioral: Negative for confusion and suicidal ideas.    Objective:    BP 120/80   Pulse 91   Ht 5' (1.524 m)   Wt 182 lb (82.6 kg)   BMI 35.54 kg/m   Wt Readings from Last 3 Encounters:  01/18/18 182 lb (82.6 kg)  11/11/16 177 lb (80.3 kg)  06/29/16 177 lb (80.3 kg)     Physical Exam  Constitutional: She is oriented to person, place, and time. She appears well-developed.  HENT:  Head: Normocephalic and atraumatic.  Eyes: EOM are normal.  Neck: Normal range of motion. Neck supple. No tracheal deviation present. No thyromegaly present.  Cardiovascular: Normal rate and regular rhythm.  Pulmonary/Chest: Effort normal and breath sounds normal.  Abdominal: Soft. Bowel sounds are normal. There is no tenderness. There is no guarding.  Musculoskeletal: Normal range of motion. She exhibits no edema.  Neurological: She is alert  and oriented to person, place, and time. She has normal reflexes. No cranial nerve deficit. Coordination normal.  Skin: Skin is warm and dry. No rash noted. No erythema. No pallor.  Psychiatric:  She has been reluctant affect.      CMP ( most recent) CMP     Component Value Date/Time   PROT 6.6 02/20/2010 1607   ALBUMIN 4.0 02/20/2010 1607   AST 19 02/20/2010 1607   ALT 30 02/20/2010 1607   ALKPHOS 61 02/20/2010 1607   BILITOT 0.9 02/20/2010 1607     Diabetic Labs (most recent): Lab Results  Component Value Date   HGBA1C 13 12/03/2017     Lipid Panel ( most recent) Lipid Panel  No results found for: CHOL, TRIG, HDL, CHOLHDL, VLDL, LDLCALC, LDLDIRECT    Lab Results  Component Value Date   TSH 2.79 12/03/2017   TSH 1.66 02/09/2010     Assessment & Plan:   1. Uncontrolled type 2 diabetes mellitus with hyperglycemia (HCC)  - Amber Henry has currently uncontrolled symptomatic type 2 DM since 27 years of age,  with most recent A1c of 13 %. Recent labs reviewed.  -her diabetes is complicated by obesity/sedentary life, multiple mood disorders, and Amber Henry remains at a high risk for more acute and chronic complications which include CAD, CVA, CKD, retinopathy, and neuropathy. These are all discussed in detail with the patient.  - I have counseled her on diet management and weight loss, by adopting a carbohydrate restricted/protein rich diet.  - Suggestion is made for her to avoid simple carbohydrates  from her diet including Cakes, Sweet Desserts, Ice Cream, Soda (diet and regular), Sweet Tea, Candies, Chips, Cookies, Store Bought Juices, Alcohol in Excess of  1-2 drinks a day, Artificial Sweeteners, and "Sugar-free" Products. This will help patient to have stable blood glucose profile and potentially avoid unintended weight gain.  - I encouraged her to switch to  unprocessed or minimally processed complex starch and increased protein intake (animal or plant  source), fruits, and vegetables.  - she is advised to stick to a routine mealtimes to eat 3 meals  a day and  avoid unnecessary snacks ( to snack only to correct hypoglycemia).   - she will be scheduled with Norm Salt, RDN, CDE for individualized diabetes education.  - I have approached her with the following individualized plan to manage diabetes and patient agrees:   -Based on her current glycemic burden of 13% A1c, she may need intensive treatment with basal/bolus insulin.   -In preparation, I have approached her for initiation of strict  monitoring of glucose 4 times a day-before meals and at bedtime.  -In the meantime, I advised her to increase Lantus 30 units nightly. -Patient is encouraged to call clinic for blood glucose levels less than 70 or above 300 mg /dl. -She does not have contraindications for use of metformin for now.  I discussed and initiated low-dose metformin 500 mg p.o. twice daily after meals. - she will be considered for incretin therapy as appropriate next visit. - Patient specific target  A1c;  LDL, HDL, Triglycerides, and  Waist Circumference were discussed in detail.  2) BP/HTN: Her blood pressure is controlled to target.  Is not on any medications, advised to consider smoking cessation.  3) Lipids/HPL:   She does not have recent lipid panel to review, not on statins.  4)  Weight/Diet: CDE Consult will be initiated , exercise, and detailed carbohydrates information provided. 5) hypothyroidism-long-standing diagnosis -Her recent thyroid function tests are consistent with appropriate replacement.  She is advised to continue levothyroxine 88 mcg p.o. nightly.  - We discussed about correct intake of levothyroxine, at fasting, with water, separated by at least 30 minutes from breakfast, and separated by more than 4 hours from calcium, iron, multivitamins, acid reflux medications (PPIs). -Patient is made aware of the fact that thyroid hormone replacement is needed  for life, dose to be adjusted by periodic monitoring of thyroid function tests.  6) Chronic Care/Health Maintenance:  -she   is encouraged to continue to follow up with Ophthalmology, Dentist,  Podiatrist at least yearly or according to recommendations, and advised to  quit smoking. I have recommended yearly flu vaccine and pneumonia vaccination at least every 5 years; moderate intensity exercise for up to 150 minutes weekly; and  sleep for at least 7 hours a day.  - I advised patient to maintain close follow up with Avon Gully, MD for primary care needs.  - Time spent with the patient: 45 minutes, of which >50% was spent in obtaining information about her symptoms, reviewing her previous labs, evaluations, and treatments, counseling her about her currently uncontrolled type 2 diabetes, hypothyroidism, and developing a plan to confirm the diagnosis and long term treatment as necessary.  Amber Henry participated in the discussions, expressed understanding, and voiced agreement with the above plans.  All questions were answered to her satisfaction. she is encouraged to contact clinic should she have any questions or concerns prior to her return visit.  Follow up plan: - Return in about 1 week (around 01/25/2018) for follow up with meter and logs- no labs.  Marquis Lunch, MD [facility] [address] [place], [place] [address] Phone: [phone]  Fax: [phone]    01/18/2018, 4:49 PM  This note was partially dictated with voice recognition software. Similar sounding words can be transcribed inadequately or may not  be corrected upon review.

## 2018-01-27 ENCOUNTER — Ambulatory Visit: Payer: Medicaid Other | Admitting: "Endocrinology

## 2018-01-28 ENCOUNTER — Encounter: Payer: Self-pay | Admitting: "Endocrinology

## 2018-03-10 ENCOUNTER — Ambulatory Visit: Payer: Medicaid Other | Admitting: Nutrition

## 2018-03-18 ENCOUNTER — Emergency Department (HOSPITAL_COMMUNITY)
Admission: EM | Admit: 2018-03-18 | Discharge: 2018-03-18 | Disposition: A | Discharge status: Home / Self Care | Payer: Medicaid Other | Attending: Emergency Medicine | Admitting: Emergency Medicine

## 2018-03-18 ENCOUNTER — Other Ambulatory Visit: Payer: Self-pay

## 2018-03-18 ENCOUNTER — Encounter (HOSPITAL_COMMUNITY): Payer: Self-pay | Admitting: Emergency Medicine

## 2018-03-18 DIAGNOSIS — N898 Other specified noninflammatory disorders of vagina: Secondary | ICD-10-CM | POA: Diagnosis not present

## 2018-03-18 DIAGNOSIS — R102 Pelvic and perineal pain: Secondary | ICD-10-CM | POA: Diagnosis present

## 2018-03-18 DIAGNOSIS — E119 Type 2 diabetes mellitus without complications: Secondary | ICD-10-CM | POA: Insufficient documentation

## 2018-03-18 DIAGNOSIS — E039 Hypothyroidism, unspecified: Secondary | ICD-10-CM | POA: Insufficient documentation

## 2018-03-18 DIAGNOSIS — F17210 Nicotine dependence, cigarettes, uncomplicated: Secondary | ICD-10-CM | POA: Diagnosis not present

## 2018-03-18 DIAGNOSIS — Z79899 Other long term (current) drug therapy: Secondary | ICD-10-CM | POA: Diagnosis not present

## 2018-03-18 DIAGNOSIS — Z794 Long term (current) use of insulin: Secondary | ICD-10-CM | POA: Insufficient documentation

## 2018-03-18 DIAGNOSIS — A6000 Herpesviral infection of urogenital system, unspecified: Secondary | ICD-10-CM | POA: Diagnosis not present

## 2018-03-18 DIAGNOSIS — J45909 Unspecified asthma, uncomplicated: Secondary | ICD-10-CM | POA: Diagnosis not present

## 2018-03-18 HISTORY — DX: Type 2 diabetes mellitus without complications: E11.9

## 2018-03-18 LAB — CBG MONITORING, ED: Glucose-Capillary: 389 mg/dL — ABNORMAL HIGH (ref 70–99)

## 2018-03-18 MED ORDER — AZITHROMYCIN 1 G PO PACK
1.0000 g | PACK | Freq: Once | ORAL | Status: AC
  Administered 2018-03-18: 1 g via ORAL
  Filled 2018-03-18: qty 1

## 2018-03-18 MED ORDER — TRAMADOL HCL 50 MG PO TABS: 50.0000 mg | ORAL_TABLET | Freq: Four times a day (QID) | ORAL | 0 refills | Status: DC | PRN

## 2018-03-18 MED ORDER — FAMCICLOVIR 500 MG PO TABS
500.0000 mg | ORAL_TABLET | Freq: Once | ORAL | Status: AC
  Administered 2018-03-18: 500 mg via ORAL
  Filled 2018-03-18: qty 1

## 2018-03-18 MED ORDER — CEFTRIAXONE SODIUM 250 MG IJ SOLR
250.0000 mg | Freq: Once | INTRAMUSCULAR | Status: AC
  Administered 2018-03-18: 250 mg via INTRAMUSCULAR
  Filled 2018-03-18: qty 250

## 2018-03-18 MED ORDER — FAMCICLOVIR 500 MG PO TABS: 500.0000 mg | ORAL_TABLET | Freq: Three times a day (TID) | ORAL | 0 refills | Status: DC

## 2018-03-18 MED ORDER — LIDOCAINE HCL (PF) 1 % IJ SOLN
INTRAMUSCULAR | Status: AC
  Filled 2018-03-18: qty 2

## 2018-03-18 NOTE — ED Triage Notes (Signed)
Patient reports vaginal pain, burning with urination, and painful bowel movements. Patient was recently treated for bacterial vaginosis (approx 2 months ago). Patient was also recently hospitalized for hyperglycemia.

## 2018-03-18 NOTE — ED Provider Notes (Signed)
[facility] EMERGENCY DEPARTMENT Provider Note   CSN: [id number] Arrival date & time: 03/18/18  1851     History   Chief Complaint Chief Complaint  Patient presents with  . Groin Pain    HPI Amber Henry is a 27 y.o. female.  Patient complains of severe pain at the opening of her vagina.  The history is provided by the patient. No language interpreter was used.  Vaginal Discharge   This is a new problem. The current episode started 12 to 24 hours ago. The problem occurs constantly. The problem has not changed since onset.The discharge occurs during intercourse. The discharge was white. Pertinent negatives include no anorexia, no abdominal pain, no diarrhea and no frequency. She has tried nothing for the symptoms. The treatment provided no relief.    Past Medical History:  Diagnosis Date  . ADHD (attention deficit hyperactivity disorder)   . Anxiety   . Asthma   . Bipolar 1 disorder (HCC)   . Borderline diabetes   . DDD (degenerative disc disease), lumbar   . Depression   . Diabetes mellitus without complication (HCC)   . Fibromyalgia     Patient Active Problem List   Diagnosis Date Noted  . Uncontrolled type 2 diabetes mellitus with hyperglycemia (HCC) 01/18/2018  . Hypothyroidism 01/18/2018  . Class 2 severe obesity due to excess calories with serious comorbidity and body mass index (BMI) of 35.0 to 35.9 in adult (HCC) 01/18/2018  . Current smoker 01/18/2018  . ILEUS 02/20/2010  . NAUSEA AND VOMITING 02/15/2010  . ABDOMINAL PAIN, UPPER 02/15/2010  . TRANSAMINASES, SERUM, ELEVATED 02/15/2010    Past Surgical History:  Procedure Laterality Date  . CHOLECYSTECTOMY       OB History   None      Home Medications    Prior to Admission medications   Medication Sig Start Date End Date Taking? Authorizing Provider  ARIPiprazole (ABILIFY) 15 MG tablet Take by mouth daily.   Yes [provider]  CASANTHRANOL-DOCUSATE SODIUM PO Take 1 tablet by  mouth 2 (two) times daily.   Yes [provider]  cyclobenzaprine (FLEXERIL) 10 MG tablet Take 1 tablet (10 mg total) by mouth 3 (three) times daily as needed for muscle spasms. 02/09/13  Yes Devoria Albe, MD  hydrOXYzine (VISTARIL) 100 MG capsule Take 1 tablet by mouth 2 (two) times daily.   Yes [provider]  Insulin Glargine (LANTUS SOLOSTAR) 100 UNIT/ML Solostar Pen Inject 30 Units into the skin daily at 10 pm.   Yes [provider]  levothyroxine (SYNTHROID, LEVOTHROID) 88 MCG tablet Take by mouth daily. 07/20/17 07/20/18 Yes [provider]  LYRICA 150 MG capsule Take 150 mg by mouth 2 (two) times daily. 02/26/18  Yes [provider]  medroxyPROGESTERone (DEPO-PROVERA) 150 MG/ML injection Inject into the muscle. 02/22/18  Yes [provider]  Multiple Vitamin (MULTI-VITAMINS) TABS Take by mouth.   Yes [provider]  pregabalin (LYRICA) 75 MG capsule Take by mouth 2 (two) times daily.   Yes [provider]  tiZANidine (ZANAFLEX) 4 MG tablet Take 4 mg by mouth every 8 (eight) hours as needed. 02/26/18  Yes [provider]  famciclovir (FAMVIR) 500 MG tablet Take 1 tablet (500 mg total) by mouth 3 (three) times daily. 03/18/18   Bethann Berkshire, MD  meclizine (ANTIVERT) 25 MG tablet Take 1 tablet by mouth as needed for dizziness.  03/15/18   [provider]  metFORMIN (GLUCOPHAGE) 500 MG tablet Take 1  tablet (500 mg total) by mouth 2 (two) times daily with a meal. 01/18/18   Nida, Denman George, MD  traMADol (ULTRAM) 50 MG tablet Take 1 tablet (50 mg total) by mouth every 6 (six) hours as needed. 03/18/18   Bethann Berkshire, MD    Family History Family History  Problem Relation Age of Onset  . Cancer Mother        breast  . Kidney disease Mother        kidney failure  . Hypertension Other   . Heart disease Other   . Diabetes Other     Social History Social History   Tobacco Use  . Smoking status: Current  Every Day Smoker    Types: Cigarettes  . Smokeless tobacco: Never Used  Substance Use Topics  . Alcohol use: No    Comment: occ  . Drug use: No     Allergies   Peanut butter flavor; Sulfamethoxazole-trimethoprim; Amoxicillin; Bactrim; Lithium; Other; Penicillins; Ceclor [cefaclor]; and Latex   Review of Systems Review of Systems  Constitutional: Negative for appetite change and fatigue.  HENT: Negative for congestion, ear discharge and sinus pressure.   Eyes: Negative for discharge.  Respiratory: Negative for cough.   Cardiovascular: Negative for chest pain.  Gastrointestinal: Negative for abdominal pain, anorexia and diarrhea.  Genitourinary: Positive for vaginal discharge. Negative for frequency and hematuria.       Vaginal pain.  Musculoskeletal: Negative for back pain.  Skin: Negative for rash.  Neurological: Negative for seizures and headaches.  Psychiatric/Behavioral: Negative for hallucinations.     Physical Exam Updated Vital Signs BP (!) 144/88 (BP Location: Right Arm)   Pulse 99   Temp 97.8 F (36.6 C) (Oral)   Resp 16   Ht 5' (1.524 m)   Wt 80.7 kg (178 lb)   SpO2 100%   BMI 34.76 kg/m   Physical Exam  Constitutional: She is oriented to person, place, and time. She appears well-developed.  HENT:  Head: Normocephalic.  Eyes: Conjunctivae and EOM are normal. No scleral icterus.  Neck: Neck supple. No thyromegaly present.  Cardiovascular: Normal rate and regular rhythm. Exam reveals no gallop and no friction rub.  No murmur heard. Pulmonary/Chest: No stridor. She has no wheezes. She has no rales. She exhibits no tenderness.  Abdominal: She exhibits no distension. There is no tenderness. There is no rebound.  Genitourinary:  Genitourinary Comments: Patient has ulcers on the entrance to her vagina.  Speculum exam could not be done.  Musculoskeletal: Normal range of motion. She exhibits no edema.  Lymphadenopathy:    She has no cervical adenopathy.    Neurological: She is oriented to person, place, and time. She exhibits normal muscle tone. Coordination normal.  Skin: No rash noted. No erythema.  Psychiatric: She has a normal mood and affect. Her behavior is normal.     ED Treatments / Results  Labs (all labs ordered are listed, but only abnormal results are displayed) Labs Reviewed  CBG MONITORING, ED - Abnormal; Notable for the following components:      Result Value   Glucose-Capillary 389 (*)    All other components within normal limits  HSV CULTURE AND TYPING  GC/CHLAMYDIA PROBE AMP ([place]) NOT AT [facility]    EKG None  Radiology No results found.  Procedures Procedures (including critical care time)  Medications Ordered in ED Medications  cefTRIAXone (ROCEPHIN) injection 250 mg (has no administration in time range)  azithromycin (ZITHROMAX) powder 1 g (has no administration  in time range)  famciclovir [facility]) tablet 500 mg (has no administration in time range)     Initial Impression / Assessment and Plan / ED Course  I have reviewed the triage vital signs and the nursing notes.  Pertinent labs & imaging results that were available during my care of the patient were reviewed by me and considered in my medical decision making (see chart for details).     Pain at the opening of her vagina possibly related to herpes infection.  Patient is placed on Famvir and herpes testing has been done along with GC chlamydia although a speculum was not used for the exam.  Patient is given Famvir and Ultram and will follow-up with OB/GYN  Final Clinical Impressions(s) / ED Diagnoses   Final diagnoses:  Genital herpes simplex, unspecified site    ED Discharge Orders        Ordered    famciclovir (FAMVIR) 500 MG tablet  3 times daily     03/18/18 2054    traMADol (ULTRAM) 50 MG tablet  Every 6 hours PRN     03/18/18 2054       Bethann Berkshire, MD 03/18/18 2057

## 2018-03-18 NOTE — Discharge Instructions (Addendum)
Follow-up with Dr. Emelda Fear or 1 of his partners next week.  Call tomorrow to make an appointment

## 2018-03-18 NOTE — ED Notes (Signed)
Pelvic exam completed with EDP

## 2018-03-21 LAB — HSV CULTURE AND TYPING

## 2018-03-23 LAB — GC/CHLAMYDIA PROBE AMP (~~LOC~~) NOT AT ARMC
Chlamydia: NEGATIVE
Neisseria Gonorrhea: NEGATIVE

## 2018-05-10 ENCOUNTER — Ambulatory Visit: Payer: Medicaid Other | Admitting: Nutrition

## 2018-05-24 ENCOUNTER — Other Ambulatory Visit (HOSPITAL_COMMUNITY): Payer: Self-pay | Admitting: Neurology

## 2018-05-24 ENCOUNTER — Ambulatory Visit (HOSPITAL_COMMUNITY)
Admission: RE | Admit: 2018-05-24 | Discharge: 2018-05-24 | Disposition: A | Discharge status: Home / Self Care | Payer: Medicaid Other | Source: Ambulatory Visit | Attending: Neurology | Admitting: Neurology

## 2018-05-24 DIAGNOSIS — M545 Low back pain: Secondary | ICD-10-CM | POA: Insufficient documentation

## 2018-06-15 ENCOUNTER — Other Ambulatory Visit: Payer: Self-pay | Admitting: "Endocrinology

## 2018-06-15 ENCOUNTER — Encounter: Payer: Self-pay | Admitting: Nutrition

## 2018-06-15 ENCOUNTER — Encounter: Payer: Medicaid Other | Attending: Internal Medicine | Admitting: Nutrition

## 2018-06-15 VITALS — Ht 60.0 in | Wt 179.0 lb

## 2018-06-15 DIAGNOSIS — E039 Hypothyroidism, unspecified: Secondary | ICD-10-CM

## 2018-06-15 DIAGNOSIS — IMO0002 Reserved for concepts with insufficient information to code with codable children: Secondary | ICD-10-CM

## 2018-06-15 DIAGNOSIS — E1165 Type 2 diabetes mellitus with hyperglycemia: Secondary | ICD-10-CM | POA: Diagnosis not present

## 2018-06-15 DIAGNOSIS — E669 Obesity, unspecified: Secondary | ICD-10-CM

## 2018-06-15 DIAGNOSIS — E118 Type 2 diabetes mellitus with unspecified complications: Secondary | ICD-10-CM

## 2018-06-15 NOTE — Patient Instructions (Signed)
Goals 1. Follow MY Plate 2. Eat 3 carb choices 3 Increase fresh fruits and vegetables 4. Drink only water 5 Walk 15-30 minutes a day. Take 30 units of insulin of Lantus per day Test blood sugars 4 times per day
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
## 2018-06-16 LAB — COMPREHENSIVE METABOLIC PANEL
AG Ratio: 1.7 (calc) (ref 1.0–2.5)
ALBUMIN MSPROF: 4.5 g/dL (ref 3.6–5.1)
ALT: 36 U/L — AB (ref 6–29)
AST: 25 U/L (ref 10–30)
Alkaline phosphatase (APISO): 91 U/L (ref 33–115)
BILIRUBIN TOTAL: 0.6 mg/dL (ref 0.2–1.2)
BUN: 7 mg/dL (ref 7–25)
CALCIUM: 9.8 mg/dL (ref 8.6–10.2)
CO2: 23 mmol/L (ref 20–32)
Chloride: 99 mmol/L (ref 98–110)
Creat: 0.94 mg/dL (ref 0.50–1.10)
Globulin: 2.6 g/dL (calc) (ref 1.9–3.7)
Glucose, Bld: 494 mg/dL — ABNORMAL HIGH (ref 65–139)
Potassium: 4.1 mmol/L (ref 3.5–5.3)
SODIUM: 133 mmol/L — AB (ref 135–146)
TOTAL PROTEIN: 7.1 g/dL (ref 6.1–8.1)

## 2018-06-16 LAB — T4, FREE: FREE T4: 1.4 ng/dL (ref 0.8–1.8)

## 2018-06-16 LAB — HEMOGLOBIN A1C
EAG (MMOL/L): 17.6 (calc)
HEMOGLOBIN A1C: 12.7 %{Hb} — AB (ref <5.7)
MEAN PLASMA GLUCOSE: 318 (calc)

## 2018-06-16 LAB — TSH: TSH: 2.63 mIU/L

## 2018-06-21 ENCOUNTER — Ambulatory Visit: Payer: Medicaid Other | Admitting: "Endocrinology

## 2018-06-21 ENCOUNTER — Encounter: Payer: Self-pay | Admitting: "Endocrinology

## 2018-07-13 ENCOUNTER — Ambulatory Visit: Payer: Medicaid Other | Admitting: "Endocrinology

## 2018-07-13 ENCOUNTER — Encounter: Payer: Self-pay | Admitting: "Endocrinology

## 2018-07-13 VITALS — BP 127/80 | HR 86 | Ht 60.0 in | Wt 178.0 lb

## 2018-07-13 DIAGNOSIS — F17200 Nicotine dependence, unspecified, uncomplicated: Secondary | ICD-10-CM | POA: Diagnosis not present

## 2018-07-13 DIAGNOSIS — E1165 Type 2 diabetes mellitus with hyperglycemia: Secondary | ICD-10-CM

## 2018-07-13 DIAGNOSIS — E039 Hypothyroidism, unspecified: Secondary | ICD-10-CM

## 2018-07-13 DIAGNOSIS — Z6835 Body mass index (BMI) 35.0-35.9, adult: Secondary | ICD-10-CM

## 2018-07-13 LAB — GLUCOSE, POCT (MANUAL RESULT ENTRY): POC Glucose: 359 mg/dl — AB (ref 70–99)

## 2018-07-13 NOTE — Progress Notes (Signed)
Endocrinology follow-up  Note       07/13/2018, 2:13 PM   Subjective:    Patient ID: Amber Henry, female    DOB: [birth year].  Amber Henry is being seen in follow-up  for management of currently uncontrolled symptomatic type 2 diabetes, hypothyroidism. MD: Avon Gully, MD.   Past Medical History:  Diagnosis Date  . ADHD (attention deficit hyperactivity disorder)   . Anxiety   . Asthma   . Bipolar 1 disorder (HCC)   . Borderline diabetes   . DDD (degenerative disc disease), lumbar   . Depression   . Diabetes mellitus without complication (HCC)   . Fibromyalgia    Past Surgical History:  Procedure Laterality Date  . CHOLECYSTECTOMY     Social History   Socioeconomic History  . Marital status: Legally Separated    Spouse name: Not on file  . Number of children: Not on file  . Years of education: Not on file  . Highest education level: Not on file  Occupational History  . Not on file  Social Needs  . Financial resource strain: Not on file  . Food insecurity:    Worry: Not on file    Inability: Not on file  . Transportation needs:    Medical: Not on file    Non-medical: Not on file  Tobacco Use  . Smoking status: Current Every Day Smoker    Types: Cigarettes  . Smokeless tobacco: Never Used  Substance and Sexual Activity  . Alcohol use: No    Comment: occ  . Drug use: No  . Sexual activity: Yes    Birth control/protection: None  Lifestyle  . Physical activity:    Days per week: Not on file    Minutes per session: Not on file  . Stress: Not on file  Relationships  . Social connections:    Talks on phone: Not on file    Gets together: Not on file    Attends religious service: Not on file    Active member of club or organization: Not on file    Attends meetings of clubs or organizations: Not on file    Relationship status: Not on file  Other Topics Concern  .  Not on file  Social History Narrative  . Not on file   Outpatient Encounter Medications as of 07/13/2018  Medication Sig  . insulin aspart (NOVOLOG FLEXPEN) 100 UNIT/ML FlexPen Inject 10-16 Units into the skin 3 (three) times daily before meals.  . ARIPiprazole (ABILIFY) 15 MG tablet Take by mouth daily.  Marland Kitchen CASANTHRANOL-DOCUSATE SODIUM PO Take 1 tablet by mouth 2 (two) times daily.  . cyclobenzaprine (FLEXERIL) 10 MG tablet Take 1 tablet (10 mg total) by mouth 3 (three) times daily as needed for muscle spasms.  . famciclovir (FAMVIR) 500 MG tablet Take 1 tablet (500 mg total) by mouth 3 (three) times daily.  . hydrOXYzine (VISTARIL) 100 MG capsule Take 1 tablet by mouth 2 (two) times daily.  . Insulin Glargine (LANTUS SOLOSTAR) 100 UNIT/ML Solostar Pen Inject 40 Units into the skin daily at 10 pm.  . levothyroxine (SYNTHROID, LEVOTHROID) 88 MCG  tablet Take by mouth daily.  Marland Kitchen LYRICA 150 MG capsule Take 300 mg by mouth 3 (three) times daily.   . meclizine (ANTIVERT) 25 MG tablet Take 1 tablet by mouth as needed for dizziness.   . medroxyPROGESTERone (DEPO-PROVERA) 150 MG/ML injection Inject into the muscle.  . metFORMIN (GLUCOPHAGE) 500 MG tablet Take 1 tablet (500 mg total) by mouth 2 (two) times daily with a meal.  . Multiple Vitamin (MULTI-VITAMINS) TABS Take by mouth.  . pregabalin (LYRICA) 75 MG capsule Take by mouth 2 (two) times daily.  Marland Kitchen tiZANidine (ZANAFLEX) 4 MG tablet Take 4 mg by mouth every 8 (eight) hours as needed.  . traMADol (ULTRAM) 50 MG tablet Take 1 tablet (50 mg total) by mouth every 6 (six) hours as needed.   No facility-administered encounter medications on file as of 07/13/2018.     ALLERGIES: Allergies  Allergen Reactions  . Peanut Butter Flavor Shortness Of Breath  . Sulfamethoxazole-Trimethoprim Hives and Shortness Of Breath  . Amoxicillin Nausea And Vomiting  . Bactrim Hives  . Lithium   . Other     Spandex gives patient rash Spandex gives patient rash    . Penicillins Nausea And Vomiting  . Ceclor [Cefaclor] Rash  . Latex Rash    Any latex over 5%    VACCINATION STATUS:  There is no immunization history on file for this patient.  Diabetes  She presents for her follow-up diabetic visit. She has type 2 diabetes mellitus. Her disease course has been worsening (She was diagnosed with type 2 diabetes at age 23, with A1c of 13%.). There are no hypoglycemic associated symptoms. Pertinent negatives for hypoglycemia include no confusion, headaches, pallor or seizures. Associated symptoms include fatigue, polydipsia and polyuria. Pertinent negatives for diabetes include no chest pain and no polyphagia. There are no hypoglycemic complications. Symptoms are worsening. There are no diabetic complications. Risk factors for coronary artery disease include diabetes mellitus, sedentary lifestyle, tobacco exposure, obesity and family history. Current diabetic treatment includes insulin injections (She was initiated on Lantus 20 units nightly.). Her weight is fluctuating minimally. She is following a generally unhealthy diet. When asked about meal planning, she reported none. She has not had a previous visit with a dietitian. She never participates in exercise. (She missed her appointment since May 2019.  Returns without a meter nor logs to review.  Her repeat labs show A1c of 12.7%, unchanged from 13% A1c in March 2019.   ) An ACE inhibitor/angiotensin II receptor blocker is not being taken. She does not see a podiatrist.Eye exam is not current.      Review of Systems  Constitutional: Positive for fatigue. Negative for chills, fever and unexpected weight change.  HENT: Negative for trouble swallowing and voice change.   Eyes: Negative for visual disturbance.  Respiratory: Negative for cough, shortness of breath and wheezing.   Cardiovascular: Negative for chest pain, palpitations and leg swelling.  Gastrointestinal: Negative for diarrhea, nausea and vomiting.   Endocrine: Positive for polydipsia and polyuria. Negative for cold intolerance, heat intolerance and polyphagia.  Musculoskeletal: Negative for arthralgias and myalgias.  Skin: Negative for color change, pallor, rash and wound.  Neurological: Negative for seizures and headaches.  Psychiatric/Behavioral: Negative for confusion and suicidal ideas.    Objective:    BP 127/80   Pulse 86   Ht 5' (1.524 m)   Wt 178 lb (80.7 kg)   BMI 34.76 kg/m   Wt Readings from Last 3 Encounters:  07/13/18 178 lb (80.7  kg)  06/15/18 179 lb (81.2 kg)  03/18/18 178 lb (80.7 kg)     Physical Exam  Constitutional: She is oriented to person, place, and time. She appears well-developed.  HENT:  Head: Normocephalic and atraumatic.  Eyes: EOM are normal.  Neck: Normal range of motion. Neck supple. No tracheal deviation present. No thyromegaly present.  Cardiovascular: Normal rate and regular rhythm.  Pulmonary/Chest: Effort normal and breath sounds normal.  Abdominal: Soft. Bowel sounds are normal. There is no tenderness. There is no guarding.  Musculoskeletal: Normal range of motion. She exhibits no edema.  Neurological: She is alert and oriented to person, place, and time. She has normal reflexes. No cranial nerve deficit. Coordination normal.  Skin: Skin is warm and dry. No rash noted. No erythema. No pallor.  Psychiatric:  She has been reluctant affect.      CMP ( most recent) CMP     Component Value Date/Time   NA 133 (L) 06/15/2018 1536   K 4.1 06/15/2018 1536   CL 99 06/15/2018 1536   CO2 23 06/15/2018 1536   GLUCOSE 494 (H) 06/15/2018 1536   BUN 7 06/15/2018 1536   CREATININE 0.94 06/15/2018 1536   CALCIUM 9.8 06/15/2018 1536   PROT 7.1 06/15/2018 1536   ALBUMIN 4.0 02/20/2010 1607   AST 25 06/15/2018 1536   ALT 36 (H) 06/15/2018 1536   ALKPHOS 61 02/20/2010 1607   BILITOT 0.6 06/15/2018 1536     Diabetic Labs (most recent): Lab Results  Component Value Date   HGBA1C 12.7  (H) 06/15/2018   HGBA1C 13 12/03/2017     Lab Results  Component Value Date   TSH 2.63 06/15/2018   TSH 2.79 12/03/2017   TSH 1.66 02/09/2010   FREET4 1.4 06/15/2018     Assessment & Plan:   1. Uncontrolled type 2 diabetes mellitus with hyperglycemia (HCC)  - Amber Henry has currently uncontrolled symptomatic type 2 DM since 27 years of age.  She missed her appointment since May 2019 , returns with unchanged A1c significantly above target at 12.7%. -her diabetes is complicated by noncompliance/ nonadherence,  obesity/sedentary life, multiple mood disorders, and Amber Henry remains at a high risk for more acute and chronic complications which include CAD, CVA, CKD, retinopathy, and neuropathy. These are all discussed in detail with the patient.  - I have counseled her on diet management and weight loss, by adopting a carbohydrate restricted/protein rich diet.  -  Suggestion is made for her to avoid simple carbohydrates  from her diet including Cakes, Sweet Desserts / Pastries, Ice Cream, Soda (diet and regular), Sweet Tea, Candies, Chips, Cookies, Store Bought Juices, Alcohol in Excess of  1-2 drinks a day, Artificial Sweeteners, and "Sugar-free" Products. This will help patient to have stable blood glucose profile and potentially avoid unintended weight gain.   - I encouraged her to switch to  unprocessed or minimally processed complex starch and increased protein intake (animal or plant source), fruits, and vegetables.  - she is advised to stick to a routine mealtimes to eat 3 meals  a day and avoid unnecessary snacks ( to snack only to correct hypoglycemia).   - she will be scheduled with Norm Salt, RDN, CDE for individualized diabetes education.  - I have approached her with the following individualized plan to manage diabetes and patient agrees:   -Based on her current glycemic burden of 12.7% A1c, she will need intensive treatment with basal/bolus insulin.    -She has questionable  compliance and engagement.  First priority would be to avoid inadvertent hypoglycemia.   -I advised her to increase her Lantus to 40 units nightly, lower her NovoLog to 10-16 units 3 times daily AC for pre-meal blood glucose readings of 90 mg/dL or above.  He is urged to start and continue with strict monitoring of blood glucose 4 times a day-daily before meals and at bedtime.   -She is warned not to take insulin without proper monitoring of blood glucose. -Patient is encouraged to call clinic for blood glucose levels less than 70 or above 300 mg /dl. -She does not have contraindications for use of metformin for now.  I discussed and initiated low-dose metformin 500 mg p.o. twice daily after meals. - she will be considered for incretin therapy as appropriate next visit. - Patient specific target  A1c;  LDL, HDL, Triglycerides, and  Waist Circumference were discussed in detail.  2) BP/HTN: Her blood pressure is controlled to target.  She is not on any medications, advised to consider smoking cessation.  3) Lipids/HPL:   She does not have recent lipid panel to review, not on statins.  4)  Weight/Diet: CDE Consult will be initiated , exercise, and detailed carbohydrates information provided. 5) hypothyroidism-long-standing diagnosis -Her recent thyroid function tests are consistent with appropriate replacement.  She is advised to continue levothyroxine 88 mcg p.o. every morning.    - We discussed about correct intake of levothyroxine, at fasting, with water, separated by at least 30 minutes from breakfast, and separated by more than 4 hours from calcium, iron, multivitamins, acid reflux medications (PPIs). -Patient is made aware of the fact that thyroid hormone replacement is needed for life, dose to be adjusted by periodic monitoring of thyroid function tests.  6) Chronic Care/Health Maintenance:  -she   is encouraged to continue to follow up with Ophthalmology, Dentist,   Podiatrist at least yearly or according to recommendations, and advised to  quit smoking. I have recommended yearly flu vaccine and pneumonia vaccination at least every 5 years; moderate intensity exercise for up to 150 minutes weekly; and  sleep for at least 7 hours a day.  - I advised patient to maintain close follow up with Avon Gully, MD for primary care needs.  - Time spent with the patient: 25 min, of which >50% was spent in reviewing her  current and  previous labs, previous treatments, and medications doses and developing a plan for long-term care.  Marcheta Grammes participated in the discussions, expressed understanding, and voiced agreement with the above plans.  All questions were answered to her satisfaction. she is encouraged to contact clinic should she have any questions or concerns prior to her return visit.  Follow up plan: - Return in about 4 weeks (around 08/10/2018) for Follow up with Meter and Logs Only - no Labs.  Marquis Lunch, MD [facility] [address] [place], [place] [address] Phone: [phone]  Fax: [phone]    07/13/2018, 2:13 PM  This note was partially dictated with voice recognition software. Similar sounding words can be transcribed inadequately or may not  be corrected upon review.

## 2018-07-13 NOTE — Patient Instructions (Signed)

## 2018-07-28 ENCOUNTER — Ambulatory Visit: Payer: Medicaid Other | Admitting: Nutrition

## 2018-08-10 ENCOUNTER — Ambulatory Visit: Payer: Medicaid Other | Admitting: "Endocrinology

## 2019-01-10 ENCOUNTER — Other Ambulatory Visit: Payer: Self-pay | Admitting: "Endocrinology

## 2019-01-20 ENCOUNTER — Other Ambulatory Visit: Payer: Self-pay | Admitting: "Endocrinology

## 2019-08-15 LAB — TSH: TSH: 4.93 (ref <=5.90)

## 2019-08-31 ENCOUNTER — Other Ambulatory Visit: Payer: Self-pay

## 2019-08-31 ENCOUNTER — Other Ambulatory Visit (HOSPITAL_COMMUNITY): Payer: Self-pay | Admitting: Neurology

## 2019-08-31 ENCOUNTER — Ambulatory Visit (HOSPITAL_COMMUNITY)
Admission: RE | Admit: 2019-08-31 | Discharge: 2019-08-31 | Disposition: A | Discharge status: Home / Self Care | Payer: Medicaid Other | Source: Ambulatory Visit | Attending: Neurology | Admitting: Neurology

## 2019-08-31 DIAGNOSIS — M545 Low back pain, unspecified: Secondary | ICD-10-CM

## 2019-08-31 DIAGNOSIS — M5134 Other intervertebral disc degeneration, thoracic region: Secondary | ICD-10-CM | POA: Insufficient documentation

## 2019-08-31 DIAGNOSIS — M5440 Lumbago with sciatica, unspecified side: Secondary | ICD-10-CM

## 2019-09-01 ENCOUNTER — Other Ambulatory Visit: Payer: Self-pay | Admitting: Neurology

## 2019-09-01 ENCOUNTER — Other Ambulatory Visit (HOSPITAL_COMMUNITY): Payer: Self-pay | Admitting: Neurology

## 2019-09-01 DIAGNOSIS — M545 Low back pain, unspecified: Secondary | ICD-10-CM

## 2019-09-01 DIAGNOSIS — M5416 Radiculopathy, lumbar region: Secondary | ICD-10-CM

## 2019-09-13 ENCOUNTER — Encounter (HOSPITAL_COMMUNITY): Payer: Self-pay

## 2019-09-13 ENCOUNTER — Ambulatory Visit (HOSPITAL_COMMUNITY): Payer: Medicaid Other

## 2019-09-30 ENCOUNTER — Other Ambulatory Visit: Payer: Self-pay

## 2019-09-30 ENCOUNTER — Encounter (HOSPITAL_COMMUNITY): Payer: Self-pay | Admitting: Emergency Medicine

## 2019-09-30 ENCOUNTER — Emergency Department (HOSPITAL_COMMUNITY)
Admission: EM | Admit: 2019-09-30 | Discharge: 2019-09-30 | Disposition: A | Discharge status: Home / Self Care | Payer: Medicaid Other | Attending: Emergency Medicine | Admitting: Emergency Medicine

## 2019-09-30 DIAGNOSIS — Z91010 Allergy to peanuts: Secondary | ICD-10-CM | POA: Insufficient documentation

## 2019-09-30 DIAGNOSIS — Z794 Long term (current) use of insulin: Secondary | ICD-10-CM | POA: Diagnosis not present

## 2019-09-30 DIAGNOSIS — R0789 Other chest pain: Secondary | ICD-10-CM | POA: Diagnosis present

## 2019-09-30 DIAGNOSIS — Z87891 Personal history of nicotine dependence: Secondary | ICD-10-CM | POA: Diagnosis not present

## 2019-09-30 DIAGNOSIS — T7840XA Allergy, unspecified, initial encounter: Secondary | ICD-10-CM

## 2019-09-30 DIAGNOSIS — J449 Chronic obstructive pulmonary disease, unspecified: Secondary | ICD-10-CM | POA: Diagnosis not present

## 2019-09-30 DIAGNOSIS — E119 Type 2 diabetes mellitus without complications: Secondary | ICD-10-CM | POA: Insufficient documentation

## 2019-09-30 DIAGNOSIS — Z79899 Other long term (current) drug therapy: Secondary | ICD-10-CM | POA: Diagnosis not present

## 2019-09-30 DIAGNOSIS — E039 Hypothyroidism, unspecified: Secondary | ICD-10-CM | POA: Insufficient documentation

## 2019-09-30 DIAGNOSIS — Z91040 Latex allergy status: Secondary | ICD-10-CM | POA: Diagnosis not present

## 2019-09-30 MED ORDER — PREDNISONE 20 MG PO TABS: 40.0000 mg | ORAL_TABLET | Freq: Every day | ORAL | 0 refills | Status: DC

## 2019-09-30 MED ORDER — DIPHENHYDRAMINE HCL 25 MG PO CAPS
25.0000 mg | ORAL_CAPSULE | Freq: Once | ORAL | Status: AC
  Administered 2019-09-30: 25 mg via ORAL
  Filled 2019-09-30: qty 1

## 2019-09-30 MED ORDER — DEXAMETHASONE SODIUM PHOSPHATE 10 MG/ML IJ SOLN
10.0000 mg | Freq: Once | INTRAMUSCULAR | Status: AC
  Administered 2019-09-30: 11:00:00 10 mg via INTRAMUSCULAR
  Filled 2019-09-30: qty 1

## 2019-09-30 MED ORDER — FAMOTIDINE 20 MG PO TABS
20.0000 mg | ORAL_TABLET | Freq: Once | ORAL | Status: AC
  Administered 2019-09-30: 20 mg via ORAL
  Filled 2019-09-30: qty 1

## 2019-09-30 NOTE — ED Triage Notes (Signed)
Pt reports used epi pen approximately 10 minutes prior to arrival. Pt reports had allergic reaction to short haired dog breeds and dog saliva. Pt reports throat began to swell and had hives on chest. Pt alert and oriented, generalized trembling noted, airway patent. Pt reports symptoms are going away. Denies throat swelling/pressure at this time.

## 2019-09-30 NOTE — Discharge Instructions (Signed)
Continue to take Benadryl, 1 capsule every 4-6 hours as needed for itching.  Start the prednisone tomorrow.  Follow-up with your primary doctor return to the ER for any worsening symptoms.

## 2019-09-30 NOTE — ED Provider Notes (Signed)
[facility] Provider Note   CSN: [id number] Arrival date & time: 09/30/19  1030     History Chief Complaint  Patient presents with  . Allergic Reaction    CHINELO BENN is a 29 y.o. female.  HPI      ROBIE OATS is a 29 y.o. female who presents to the Emergency Department complaining of onset of chest tightness, itching, and irritation to her throat.  Symptoms began approximately 1 hour ago after her dog lay down on her chest.  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  She used her EpiPen and took 1 Benadryl capsule prior to arrival.  She reports that her symptoms are improving, but she continues to have chest tightness and irritation of her throat.  She reports noticing hives on her chest and both arms at onset, but has also improved.  She denies wheezing, shortness of breath, swelling of her face lips or tongue.   Past Medical History:  Diagnosis Date  . ADHD (attention deficit hyperactivity disorder)   . Anxiety   . Asthma   . Bipolar 1 disorder ([place])   . Borderline diabetes   . DDD (degenerative disc disease), lumbar   . Depression   . Diabetes mellitus without complication ([place])   . Fibromyalgia     Patient Active Problem List   Diagnosis Date Noted  . Uncontrolled type 2 diabetes mellitus with hyperglycemia ([place]) 01/18/2018  . Hypothyroidism 01/18/2018  . Class 2 severe obesity due to excess calories with serious comorbidity and body mass index (BMI) of 35.0 to 35.9 in adult ([place]) 01/18/2018  . Current smoker 01/18/2018  . ILEUS 02/20/2010  . NAUSEA AND VOMITING 02/15/2010  . ABDOMINAL PAIN, UPPER 02/15/2010  . TRANSAMINASES, SERUM, ELEVATED 02/15/2010    Past Surgical History:  Procedure Laterality Date  . CHOLECYSTECTOMY       OB History   No obstetric history on file.     Family History  Problem Relation Age of Onset  . Cancer Mother        breast  . Kidney disease Mother        kidney  failure  . Hypertension Other   . Heart disease Other   . Diabetes Other     Social History   Tobacco Use  . Smoking status: Former Smoker    Types: Cigarettes  . Smokeless tobacco: Never Used  Substance Use Topics  . Alcohol use: No    Comment: occ  . Drug use: No    Home Medications Prior to Admission medications   Medication Sig Start Date End Date Taking? Authorizing Provider  ARIPiprazole (ABILIFY) 15 MG tablet Take by mouth daily.    [provider]  CASANTHRANOL-DOCUSATE SODIUM PO Take 1 tablet by mouth 2 (two) times daily.    [provider]  cyclobenzaprine (FLEXERIL) 10 MG tablet Take 1 tablet (10 mg total) by mouth 3 (three) times daily as needed for muscle spasms. 02/09/13   Rolland Porter, MD  famciclovir (FAMVIR) 500 MG tablet Take 1 tablet (500 mg total) by mouth 3 (three) times daily. 03/18/18   Milton Ferguson, MD  hydrOXYzine (VISTARIL) 100 MG capsule Take 1 tablet by mouth 2 (two) times daily.    [provider]  insulin aspart (NOVOLOG FLEXPEN) 100 UNIT/ML FlexPen Inject 10-16 Units into the skin 3 (three) times daily before meals.    [provider]  Insulin Glargine (LANTUS SOLOSTAR) 100 UNIT/ML Solostar Pen  Inject 40 Units into the skin daily at 10 pm.    [provider]  levothyroxine (SYNTHROID, LEVOTHROID) 88 MCG tablet Take by mouth daily. 07/20/17 07/20/18  [provider]  LYRICA 150 MG capsule Take 300 mg by mouth 3 (three) times daily.  02/26/18   [provider]  meclizine (ANTIVERT) 25 MG tablet Take 1 tablet by mouth as needed for dizziness.  03/15/18   [provider]  medroxyPROGESTERone (DEPO-PROVERA) 150 MG/ML injection Inject into the muscle. 02/22/18   [provider]  metFORMIN (GLUCOPHAGE) 500 MG tablet Take 1 tablet (500 mg total) by mouth 2 (two) times daily with a meal. 01/18/18   Nida, Denman George, MD  Multiple Vitamin (MULTI-VITAMINS) TABS Take by mouth.    [provider]  pregabalin (LYRICA) 75 MG capsule Take by mouth 2 (two) times daily.    [provider]  tiZANidine (ZANAFLEX) 4 MG tablet Take 4 mg by mouth every 8 (eight) hours as needed. 02/26/18   [provider]  traMADol (ULTRAM) 50 MG tablet Take 1 tablet (50 mg total) by mouth every 6 (six) hours as needed. 03/18/18   Bethann Berkshire, MD    Allergies    Peanut butter flavor, Sulfamethoxazole-trimethoprim, Amoxicillin, Bactrim, Lithium, Other, Penicillins, Ceclor [cefaclor], and Latex  Review of Systems   Review of Systems  Constitutional: Negative for activity change, appetite change, chills and fever.  HENT: Positive for trouble swallowing. Negative for facial swelling, sore throat and voice change.   Respiratory: Positive for chest tightness. Negative for shortness of breath and wheezing.   Gastrointestinal: Negative for abdominal pain, diarrhea, nausea and vomiting.  Genitourinary: Negative for difficulty urinating and dysuria.  Musculoskeletal: Negative for neck pain and neck stiffness.  Skin: Positive for rash. Negative for wound.  Neurological: Negative for dizziness, syncope, weakness, numbness and headaches.    Physical Exam Updated Vital Signs BP (!) 150/83 (BP Location: Right Arm)   Pulse 90   Temp 98.5 F (36.9 C) (Oral)   Resp 18   Ht 5' (1.524 m)   Wt 72 kg Comment: pt reports stopped taking medication that caused pt to gain weight.  LMP 09/21/2019   SpO2 98%   BMI 31.00 kg/m   Physical Exam Vitals and nursing note reviewed.  Constitutional:      General: She is not in acute distress.    Appearance: Normal appearance. She is not toxic-appearing.  HENT:     Mouth/Throat:     Mouth: Mucous membranes are moist. No angioedema.     Pharynx: Oropharynx is clear. Uvula midline. No pharyngeal swelling, oropharyngeal exudate, posterior oropharyngeal erythema or uvula swelling.     Comments: No appreciable edema of the face, lips or tongue.  Uvula  is midline and nonedematous. Cardiovascular:     Rate and Rhythm: Normal rate and regular rhythm.     Pulses: Normal pulses.  Pulmonary:     Effort: Pulmonary effort is normal.     Breath sounds: Normal breath sounds.  Chest:     Chest wall: No tenderness.  Musculoskeletal:        General: Normal range of motion.     Cervical back: Normal range of motion.  Skin:    General: Skin is warm.     Capillary Refill: Capillary refill takes less than 2 seconds.     Findings: No erythema or rash.     Comments: No apparent skin changes, urticaria or rash.  Neurological:  General: No focal deficit present.     Sensory: No sensory deficit.     Motor: No weakness.     ED Results / Procedures / Treatments   Labs (all labs ordered are listed, but only abnormal results are displayed) Labs Reviewed - No data to display  EKG None  Radiology No results found.  Procedures Procedures (including critical care time)  Medications Ordered in ED Medications  dexamethasone (DECADRON) injection 10 mg (has no administration in time range)  diphenhydrAMINE (BENADRYL) capsule 25 mg (has no administration in time range)  famotidine (PEPCID) tablet 20 mg (has no administration in time range)    ED Course  I have reviewed the triage vital signs and the nursing notes.  Pertinent labs & imaging results that were available during my care of the patient were reviewed by me and considered in my medical decision making (see chart for details).    MDM Rules/Calculators/A&P                      Patient with reported allergic reaction secondary to exposure to dog hair and saliva.  Used EpiPen prior to arrival.  No edema of the face lips or tongue.  Patient continues to have itching of her chest and bilateral arms.  No urticaria noted.  Will give Benadryl, Decadron, H2 blocker and observe.  1220 patient has been observed in the department without further complications.  On recheck, she reports feeling  better and back to her baseline.  Airway remains patent and without edema.  No urticaria.  I feel she is appropriate for discharge home, she has EpiPen at home as well as Benadryl.  Will write short course of steroids, she agrees to outpatient follow-up if needed.  Return precautions also discussed.   Final Clinical Impression(s) / ED Diagnoses Final diagnoses:  Allergic reaction, initial encounter    Rx / DC Orders ED Discharge Orders    None       Pauline Aus, PA-C 09/30/19 1231    Geoffery Lyons, MD 10/01/19 [phone]

## 2019-10-20 ENCOUNTER — Other Ambulatory Visit: Payer: Self-pay

## 2019-10-20 ENCOUNTER — Encounter (HOSPITAL_COMMUNITY): Payer: Self-pay | Admitting: *Deleted

## 2019-10-20 ENCOUNTER — Emergency Department (HOSPITAL_COMMUNITY)
Admission: EM | Admit: 2019-10-20 | Discharge: 2019-10-20 | Disposition: A | Discharge status: Home / Self Care | Payer: Medicaid Other | Attending: Emergency Medicine | Admitting: Emergency Medicine

## 2019-10-20 DIAGNOSIS — E119 Type 2 diabetes mellitus without complications: Secondary | ICD-10-CM | POA: Insufficient documentation

## 2019-10-20 DIAGNOSIS — Z87891 Personal history of nicotine dependence: Secondary | ICD-10-CM | POA: Insufficient documentation

## 2019-10-20 DIAGNOSIS — R102 Pelvic and perineal pain: Secondary | ICD-10-CM | POA: Diagnosis present

## 2019-10-20 DIAGNOSIS — Z79899 Other long term (current) drug therapy: Secondary | ICD-10-CM | POA: Insufficient documentation

## 2019-10-20 DIAGNOSIS — E039 Hypothyroidism, unspecified: Secondary | ICD-10-CM | POA: Insufficient documentation

## 2019-10-20 DIAGNOSIS — B373 Candidiasis of vulva and vagina: Secondary | ICD-10-CM | POA: Insufficient documentation

## 2019-10-20 DIAGNOSIS — N9089 Other specified noninflammatory disorders of vulva and perineum: Secondary | ICD-10-CM | POA: Insufficient documentation

## 2019-10-20 DIAGNOSIS — B3731 Acute candidiasis of vulva and vagina: Secondary | ICD-10-CM

## 2019-10-20 DIAGNOSIS — Z794 Long term (current) use of insulin: Secondary | ICD-10-CM | POA: Insufficient documentation

## 2019-10-20 DIAGNOSIS — Z91040 Latex allergy status: Secondary | ICD-10-CM | POA: Insufficient documentation

## 2019-10-20 LAB — WET PREP, GENITAL
Clue Cells Wet Prep HPF POC: NONE SEEN
Sperm: NONE SEEN
Trich, Wet Prep: NONE SEEN

## 2019-10-20 MED ORDER — LIDOCAINE HCL URETHRAL/MUCOSAL 2 % EX GEL
1.0000 "application " | CUTANEOUS | Status: DC | PRN
  Administered 2019-10-20: 1 via TOPICAL
  Filled 2019-10-20: qty 10

## 2019-10-20 MED ORDER — FLUCONAZOLE 100 MG PO TABS
200.0000 mg | ORAL_TABLET | Freq: Once | ORAL | Status: AC
  Administered 2019-10-20: 200 mg via ORAL
  Filled 2019-10-20: qty 2

## 2019-10-20 MED ORDER — FLUCONAZOLE 200 MG PO TABS: 200.0000 mg | ORAL_TABLET | Freq: Every day | ORAL | 0 refills | Status: DC

## 2019-10-20 MED ORDER — CLOTRIMAZOLE-BETAMETHASONE 1-0.05 % EX CREA: TOPICAL_CREAM | CUTANEOUS | 0 refills | Status: DC

## 2019-10-20 NOTE — ED Provider Notes (Signed)
[facility] EMERGENCY DEPARTMENT Provider Note   CSN: [id number] Arrival date & time: 10/20/19  0031     History Chief Complaint  Patient presents with  . Vaginitis    Amber Henry is a 29 y.o. female.  Patient presents to the emergency department for evaluation of vaginal pain and swelling.  Symptoms ongoing for several weeks.  Patient reports that she went to [facility] and was given Macrobid for possible UTI but symptoms have not improved.        Past Medical History:  Diagnosis Date  . ADHD (attention deficit hyperactivity disorder)   . Anxiety   . Asthma   . Bipolar 1 disorder ([facility])   . Borderline diabetes   . DDD (degenerative disc disease), lumbar   . Depression   . Diabetes mellitus without complication ([facility])   . Fibromyalgia     Patient Active Problem List   Diagnosis Date Noted  . Uncontrolled type 2 diabetes mellitus with hyperglycemia ([facility]) 01/18/2018  . Hypothyroidism 01/18/2018  . Class 2 severe obesity due to excess calories with serious comorbidity and body mass index (BMI) of 35.0 to 35.9 in adult ([facility]) 01/18/2018  . Current smoker 01/18/2018  . ILEUS 02/20/2010  . NAUSEA AND VOMITING 02/15/2010  . ABDOMINAL PAIN, UPPER 02/15/2010  . TRANSAMINASES, SERUM, ELEVATED 02/15/2010    Past Surgical History:  Procedure Laterality Date  . CHOLECYSTECTOMY       OB History   No obstetric history on file.     Family History  Problem Relation Age of Onset  . Cancer Mother        breast  . Kidney disease Mother        kidney failure  . Hypertension Other   . Heart disease Other   . Diabetes Other     Social History   Tobacco Use  . Smoking status: Former Smoker    Types: Cigarettes  . Smokeless tobacco: Never Used  Substance Use Topics  . Alcohol use: No    Comment: occ  . Drug use: No    Home Medications Prior to Admission medications   Medication Sig Start Date End Date Taking? Authorizing Provider  ARIPiprazole  (ABILIFY) 15 MG tablet Take by mouth daily.    [provider]  CASANTHRANOL-DOCUSATE SODIUM PO Take 1 tablet by mouth 2 (two) times daily.    [provider]  clotrimazole-betamethasone (LOTRISONE) cream Apply to affected area 2 times daily for one week 10/20/19   Gilda Crease, MD  cyclobenzaprine (FLEXERIL) 10 MG tablet Take 1 tablet (10 mg total) by mouth 3 (three) times daily as needed for muscle spasms. 02/09/13   Devoria Albe, MD  famciclovir (FAMVIR) 500 MG tablet Take 1 tablet (500 mg total) by mouth 3 (three) times daily. 03/18/18   Bethann Berkshire, MD  fluconazole (DIFLUCAN) 200 MG tablet Take 1 tablet (200 mg total) by mouth daily. 10/20/19   Gilda Crease, MD  hydrOXYzine (VISTARIL) 100 MG capsule Take 1 tablet by mouth 2 (two) times daily.    [provider]  insulin aspart (NOVOLOG FLEXPEN) 100 UNIT/ML FlexPen Inject 10-16 Units into the skin 3 (three) times daily before meals.    [provider]  Insulin Glargine (LANTUS SOLOSTAR) 100 UNIT/ML Solostar Pen Inject 40 Units into the skin daily at 10 pm.    [provider]  levothyroxine (SYNTHROID, LEVOTHROID) 88 MCG tablet Take by mouth daily. 07/20/17 07/20/18  [provider]  LYRICA 150 MG  capsule Take 300 mg by mouth 3 (three) times daily.  02/26/18   [provider]  meclizine (ANTIVERT) 25 MG tablet Take 1 tablet by mouth as needed for dizziness.  03/15/18   [provider]  medroxyPROGESTERone (DEPO-PROVERA) 150 MG/ML injection Inject into the muscle. 02/22/18   [provider]  metFORMIN (GLUCOPHAGE) 500 MG tablet Take 1 tablet (500 mg total) by mouth 2 (two) times daily with a meal. 01/18/18   Nida, Marella Chimes, MD  Multiple Vitamin (MULTI-VITAMINS) TABS Take by mouth.    [provider]  predniSONE (DELTASONE) 20 MG tablet Take 2 tablets (40 mg total) by mouth daily. 09/30/19   Triplett, Tammy, PA-C  pregabalin (LYRICA) 75 MG capsule  Take by mouth 2 (two) times daily.    [provider]  tiZANidine (ZANAFLEX) 4 MG tablet Take 4 mg by mouth every 8 (eight) hours as needed. 02/26/18   [provider]  traMADol (ULTRAM) 50 MG tablet Take 1 tablet (50 mg total) by mouth every 6 (six) hours as needed. 03/18/18   Milton Ferguson, MD    Allergies    Peanut butter flavor, Sulfamethoxazole-trimethoprim, Amoxicillin, Bactrim, Lithium, Other, Penicillins, Ceclor [cefaclor], and Latex  Review of Systems   Review of Systems  Genitourinary: Positive for vaginal pain.  All other systems reviewed and are negative.   Physical Exam Updated Vital Signs BP 140/89 (BP Location: Right Arm)   Pulse 100   Temp 98.5 F (36.9 C) (Oral)   Resp 20   Ht 5' (1.524 m)   Wt 73.9 kg   LMP 09/21/2019   SpO2 100%   BMI 31.83 kg/m   Physical Exam Vitals and nursing note reviewed.  Constitutional:      General: She is not in acute distress.    Appearance: Normal appearance. She is well-developed.  HENT:     Head: Normocephalic and atraumatic.     Right Ear: Hearing normal.     Left Ear: Hearing normal.     Nose: Nose normal.  Eyes:     Conjunctiva/sclera: Conjunctivae normal.     Pupils: Pupils are equal, round, and reactive to light.  Cardiovascular:     Rate and Rhythm: Regular rhythm.     Heart sounds: S1 normal and S2 normal. No murmur. No friction rub. No gallop.   Pulmonary:     Effort: Pulmonary effort is normal. No respiratory distress.     Breath sounds: Normal breath sounds.  Chest:     Chest wall: No tenderness.  Abdominal:     General: Bowel sounds are normal.     Palpations: Abdomen is soft.     Tenderness: There is no abdominal tenderness. There is no guarding or rebound. Negative signs include Murphy's sign and McBurney's sign.     Hernia: No hernia is present.  Genitourinary:    Labia:        Right: Rash and tenderness present.        Left: Rash, tenderness and lesion (Elongated  laceration/ulceration at the margin of labia minora and labia majora) present.   Musculoskeletal:        General: Normal range of motion.     Cervical back: Normal range of motion and neck supple.  Skin:    General: Skin is warm and dry.     Findings: No rash.  Neurological:     Mental Status: She is alert and oriented to person, place, and time.     GCS: GCS eye subscore  is 4. GCS verbal subscore is 5. GCS motor subscore is 6.     Cranial Nerves: No cranial nerve deficit.     Sensory: No sensory deficit.     Coordination: Coordination normal.  Psychiatric:        Speech: Speech normal.        Behavior: Behavior normal.        Thought Content: Thought content normal.     ED Results / Procedures / Treatments   Labs (all labs ordered are listed, but only abnormal results are displayed) Labs Reviewed  WET PREP, GENITAL  HERPES SIMPLEX VIRUS(HSV) DNA BY PCR  GC/CHLAMYDIA PROBE AMP ([place]) NOT AT [facility]    EKG None  Radiology No results found.  Procedures Procedures (including critical care time)  Medications Ordered in ED Medications  lidocaine (XYLOCAINE) 2 % jelly 1 application (has no administration in time range)  fluconazole (DIFLUCAN) tablet 200 mg (has no administration in time range)    ED Course  I have reviewed the triage vital signs and the nursing notes.  Pertinent labs & imaging results that were available during my care of the patient were reviewed by me and considered in my medical decision making (see chart for details).    MDM Rules/Calculators/A&P                      Patient presents with severe vaginal pain.  She is a diabetic and has had difficulty managing her blood sugars.  She is working with her doctor and is getting better control but still running as high as 300 at times.  She reports that prior to this she stayed around 600, however.  Patient has significant inflammation of the labia and groin consistent with yeast infection.   Additionally, she has a lesion at the base of the left labia minora and majora.  This looks traumatic, but will culture for herpes as well.  Topical lidocaine for pain relief and will double cover with Lotrisone and Diflucan.  Final Clinical Impression(s) / ED Diagnoses Final diagnoses:  Yeast vaginitis    Rx / DC Orders ED Discharge Orders         Ordered    clotrimazole-betamethasone (LOTRISONE) cream     10/20/19 0113    fluconazole (DIFLUCAN) 200 MG tablet  Daily     10/20/19 0113           Gilda Crease, MD 10/20/19 [phone]

## 2019-10-20 NOTE — ED Triage Notes (Signed)
Pt c/o yeast infection x 3 weeks; pt states she went to [facility] and they did not look at the area and gave her a prescription of some kind and sent her home; pt states she is a diabetic and has been keeping her sugars under 300

## 2019-10-21 LAB — GC/CHLAMYDIA PROBE AMP (~~LOC~~) NOT AT ARMC
Chlamydia: NEGATIVE
Neisseria Gonorrhea: NEGATIVE

## 2019-10-23 LAB — HERPES SIMPLEX VIRUS(HSV) DNA BY PCR
HSV 1 DNA: NEGATIVE
HSV 2 DNA: POSITIVE — AB

## 2019-10-27 ENCOUNTER — Emergency Department (HOSPITAL_COMMUNITY)
Admission: EM | Admit: 2019-10-27 | Discharge: 2019-10-28 | Disposition: A | Discharge status: Home / Self Care | Payer: Medicaid Other | Attending: Emergency Medicine | Admitting: Emergency Medicine

## 2019-10-27 ENCOUNTER — Other Ambulatory Visit: Payer: Self-pay

## 2019-10-27 ENCOUNTER — Encounter (HOSPITAL_COMMUNITY): Payer: Self-pay

## 2019-10-27 DIAGNOSIS — Z79899 Other long term (current) drug therapy: Secondary | ICD-10-CM | POA: Insufficient documentation

## 2019-10-27 DIAGNOSIS — J45909 Unspecified asthma, uncomplicated: Secondary | ICD-10-CM | POA: Insufficient documentation

## 2019-10-27 DIAGNOSIS — E119 Type 2 diabetes mellitus without complications: Secondary | ICD-10-CM | POA: Insufficient documentation

## 2019-10-27 DIAGNOSIS — Y999 Unspecified external cause status: Secondary | ICD-10-CM | POA: Diagnosis not present

## 2019-10-27 DIAGNOSIS — Z91040 Latex allergy status: Secondary | ICD-10-CM | POA: Insufficient documentation

## 2019-10-27 DIAGNOSIS — Z91010 Allergy to peanuts: Secondary | ICD-10-CM | POA: Diagnosis not present

## 2019-10-27 DIAGNOSIS — Z794 Long term (current) use of insulin: Secondary | ICD-10-CM | POA: Insufficient documentation

## 2019-10-27 DIAGNOSIS — M25552 Pain in left hip: Secondary | ICD-10-CM | POA: Diagnosis present

## 2019-10-27 DIAGNOSIS — Y929 Unspecified place or not applicable: Secondary | ICD-10-CM | POA: Diagnosis not present

## 2019-10-27 DIAGNOSIS — M5442 Lumbago with sciatica, left side: Secondary | ICD-10-CM | POA: Diagnosis not present

## 2019-10-27 DIAGNOSIS — Y939 Activity, unspecified: Secondary | ICD-10-CM | POA: Insufficient documentation

## 2019-10-27 NOTE — ED Provider Notes (Signed)
[facility] EMERGENCY DEPARTMENT Provider Note   CSN: [id number] Arrival date & time: 10/27/19  2321     History Chief Complaint  Patient presents with  . Hip Pain    Amber Henry is a 29 y.o. female.  Patient presents for evaluation of left hip pain.  Patient reports that she took a step and felt a pop in the left hip area followed by sharp pain.  Pain is in the lateral aspect of the hip and radiates around to the mid back and down the leg.  Injury occurred prior to arrival.  She does have a history of chronic back problems.  She has not had any weakness in the legs.  No change in bowel or bladder function.        Past Medical History:  Diagnosis Date  . ADHD (attention deficit hyperactivity disorder)   . Anxiety   . Asthma   . Bipolar 1 disorder (HCC)   . Borderline diabetes   . DDD (degenerative disc disease), lumbar   . Depression   . Diabetes mellitus without complication (HCC)   . Fibromyalgia     Patient Active Problem List   Diagnosis Date Noted  . Uncontrolled type 2 diabetes mellitus with hyperglycemia (HCC) 01/18/2018  . Hypothyroidism 01/18/2018  . Class 2 severe obesity due to excess calories with serious comorbidity and body mass index (BMI) of 35.0 to 35.9 in adult (HCC) 01/18/2018  . Current smoker 01/18/2018  . ILEUS 02/20/2010  . NAUSEA AND VOMITING 02/15/2010  . ABDOMINAL PAIN, UPPER 02/15/2010  . TRANSAMINASES, SERUM, ELEVATED 02/15/2010    Past Surgical History:  Procedure Laterality Date  . CHOLECYSTECTOMY       OB History   No obstetric history on file.     Family History  Problem Relation Age of Onset  . Cancer Mother        breast  . Kidney disease Mother        kidney failure  . Hypertension Other   . Heart disease Other   . Diabetes Other     Social History   Tobacco Use  . Smoking status: Former Smoker    Types: Cigarettes  . Smokeless tobacco: Never Used  Substance Use Topics  . Alcohol use: No    Comment:  occ  . Drug use: No    Home Medications Prior to Admission medications   Medication Sig Start Date End Date Taking? Authorizing Provider  ARIPiprazole (ABILIFY) 15 MG tablet Take by mouth daily.    [provider]  CASANTHRANOL-DOCUSATE SODIUM PO Take 1 tablet by mouth 2 (two) times daily.    [provider]  clotrimazole-betamethasone (LOTRISONE) cream Apply to affected area 2 times daily for one week 10/20/19   Gilda Crease, MD  famciclovir (FAMVIR) 500 MG tablet Take 1 tablet (500 mg total) by mouth 3 (three) times daily. 03/18/18   Bethann Berkshire, MD  fluconazole (DIFLUCAN) 200 MG tablet Take 1 tablet (200 mg total) by mouth daily. 10/20/19   Gilda Crease, MD  hydrOXYzine (VISTARIL) 100 MG capsule Take 1 tablet by mouth 2 (two) times daily.    [provider]  ibuprofen (ADVIL) 800 MG tablet Take 1 tablet (800 mg total) by mouth every 6 (six) hours as needed for moderate pain. 10/28/19   Dameka Younker, Canary Brim, MD  insulin aspart (NOVOLOG FLEXPEN) 100 UNIT/ML FlexPen Inject 10-16 Units into the skin 3 (three) times daily before meals.    [provider]  Insulin Glargine (LANTUS SOLOSTAR) 100 UNIT/ML Solostar Pen Inject 40 Units into the skin daily at 10 pm.    [provider]  levothyroxine (SYNTHROID, LEVOTHROID) 88 MCG tablet Take by mouth daily. 07/20/17 07/20/18  [provider]  LYRICA 150 MG capsule Take 300 mg by mouth 3 (three) times daily.  02/26/18   [provider]  meclizine (ANTIVERT) 25 MG tablet Take 1 tablet by mouth as needed for dizziness.  03/15/18   [provider]  medroxyPROGESTERone (DEPO-PROVERA) 150 MG/ML injection Inject into the muscle. 02/22/18   [provider]  metFORMIN (GLUCOPHAGE) 500 MG tablet Take 1 tablet (500 mg total) by mouth 2 (two) times daily with a meal. 01/18/18   Nida, Marella Chimes, MD  Multiple Vitamin (MULTI-VITAMINS) TABS Take by mouth.    [provider]  pregabalin (LYRICA) 75 MG capsule Take by mouth 2 (two) times daily.    [provider]  tiZANidine (ZANAFLEX) 4 MG tablet Take 4 mg by mouth every 8 (eight) hours as needed. 02/26/18   [provider]    Allergies    Peanut butter flavor, Sulfamethoxazole-trimethoprim, Amoxicillin, Bactrim, Lithium, Other, Penicillins, Ceclor [cefaclor], and Latex  Review of Systems   Review of Systems  Musculoskeletal: Positive for arthralgias and back pain.  Neurological: Negative for weakness and numbness.  All other systems reviewed and are negative.   Physical Exam Updated Vital Signs BP (!) 141/98 (BP Location: Left Arm)   Pulse (!) 108   Temp 98.7 F (37.1 C) (Oral)   Resp 18   Ht 5' (1.524 m)   Wt 72.6 kg   LMP 09/16/2019 (Approximate)   SpO2 100%   BMI 31.25 kg/m   Physical Exam Vitals and nursing note reviewed.  Constitutional:      General: She is not in acute distress.    Appearance: Normal appearance. She is well-developed.  HENT:     Head: Normocephalic and atraumatic.     Right Ear: Hearing normal.     Left Ear: Hearing normal.     Nose: Nose normal.  Eyes:     Conjunctiva/sclera: Conjunctivae normal.     Pupils: Pupils are equal, round, and reactive to light.  Cardiovascular:     Rate and Rhythm: Regular rhythm.     Heart sounds: S1 normal and S2 normal. No murmur. No friction rub. No gallop.   Pulmonary:     Effort: Pulmonary effort is normal. No respiratory distress.     Breath sounds: Normal breath sounds.  Chest:     Chest wall: No tenderness.  Abdominal:     General: Bowel sounds are normal.     Palpations: Abdomen is soft.     Tenderness: There is no abdominal tenderness. There is no guarding or rebound. Negative signs include Murphy's sign and McBurney's sign.     Hernia: No hernia is present.  Musculoskeletal:     Cervical back: Normal range of motion and neck supple.     Left hip: No deformity. Decreased range of  motion.     Comments: +SLR left  Skin:    General: Skin is warm and dry.     Findings: No rash.  Neurological:     Mental Status: She is alert and oriented to person, place, and time.     GCS: GCS eye subscore is 4. GCS verbal subscore is 5. GCS motor subscore is 6.     Cranial Nerves: No cranial nerve deficit.  Sensory: No sensory deficit.     Coordination: Coordination normal.     Deep Tendon Reflexes:     Reflex Scores:      Patellar reflexes are 2+ on the right side and 2+ on the left side. Psychiatric:        Speech: Speech normal.        Behavior: Behavior normal.        Thought Content: Thought content normal.     ED Results / Procedures / Treatments   Labs (all labs ordered are listed, but only abnormal results are displayed) Labs Reviewed  POC URINE PREG, ED    EKG None  Radiology DG Hip Unilat W or Wo Pelvis 2-3 Views Left  Result Date: 10/28/2019 CLINICAL DATA:  Pop in left hip with pain EXAM: DG HIP (WITH OR WITHOUT PELVIS) 2-3V LEFT COMPARISON:  None. FINDINGS: There is no evidence of hip fracture or dislocation. There is no evidence of arthropathy or other focal bone abnormality. Metallic navel ornamentation is noted. IMPRESSION: Negative. Electronically Signed   By: Kreg Shropshire M.D.   On: 10/28/2019 00:45    Procedures Procedures (including critical care time)  Medications Ordered in ED Medications - No data to display  ED Course  I have reviewed the triage vital signs and the nursing notes.  Pertinent labs & imaging results that were available during my care of the patient were reviewed by me and considered in my medical decision making (see chart for details).    MDM Rules/Calculators/A&P                      Patient presents with pain in the left hip.  She reports a popping and onset of sudden pain while walking earlier tonight.  She is very concerned that she has dislocated her hip.  I did inform her that her hip did not appear to be  dislocated and that this was likely muscular with her having pain in the posterior aspect of the hip and normal range of motion.  She was very concerned that there was something wrong with the hip and wanted an x-ray.  I did discuss with her the risk of radiation exposure and she understands.  X-ray has therefore been performed and is normal.  She does not have any neurologic deficits to suggest a significant spinal condition such as cauda equina syndrome.  She does have some radiation to the upper leg indicating some element of radiculopathy/sciatica.  Patient is a poorly controlled diabetic, therefore steroids would not be utilized.  Treat with rest, NSAIDs.  Final Clinical Impression(s) / ED Diagnoses Final diagnoses:  Left hip pain  Acute left-sided low back pain with left-sided sciatica    Rx / DC Orders ED Discharge Orders         Ordered    ibuprofen (ADVIL) 800 MG tablet  Every 6 hours PRN     10/28/19 0120           Gilda Crease, MD 10/28/19 0120

## 2019-10-27 NOTE — ED Triage Notes (Signed)
Pt states she injured her left hip as she was trying to get around her dog at home, felt a pop in her left hip and has had pain since then.

## 2019-10-28 ENCOUNTER — Emergency Department (HOSPITAL_COMMUNITY): Payer: Medicaid Other

## 2019-10-28 LAB — POC URINE PREG, ED: Preg Test, Ur: NEGATIVE

## 2019-10-28 MED ORDER — IBUPROFEN 800 MG PO TABS: 800.0000 mg | ORAL_TABLET | Freq: Four times a day (QID) | ORAL | 0 refills | Status: DC | PRN

## 2019-11-11 ENCOUNTER — Other Ambulatory Visit (HOSPITAL_COMMUNITY): Payer: Self-pay | Admitting: Neurology

## 2019-11-11 ENCOUNTER — Other Ambulatory Visit: Payer: Self-pay | Admitting: Neurology

## 2019-11-11 DIAGNOSIS — M545 Low back pain, unspecified: Secondary | ICD-10-CM

## 2019-11-29 ENCOUNTER — Telehealth: Payer: Self-pay | Admitting: "Endocrinology

## 2019-11-29 NOTE — Telephone Encounter (Signed)
Morrie Sheldon a Scientist, physiological would like you to call her regarding this patients medications. She is having trouble with her medicines. Last OV 07/2018. [phone]

## 2019-11-29 NOTE — Telephone Encounter (Signed)
Amber Henry left a VM for you to return her call

## 2019-11-29 NOTE — Telephone Encounter (Signed)
Discussed with Amber Henry that we have not seen pt since 2019.

## 2019-11-29 NOTE — Telephone Encounter (Signed)
Left a message requesting a return call to the office. 

## 2019-12-07 ENCOUNTER — Ambulatory Visit (HOSPITAL_COMMUNITY): Payer: Medicaid Other

## 2020-01-05 ENCOUNTER — Ambulatory Visit (HOSPITAL_COMMUNITY): Payer: Medicaid Other | Attending: Neurology

## 2020-01-05 ENCOUNTER — Encounter (HOSPITAL_COMMUNITY): Payer: Self-pay

## 2020-01-06 LAB — BASIC METABOLIC PANEL
BUN: 11 (ref 4–21)
Creatinine: 0.5 (ref 0.5–1.1)

## 2020-01-11 LAB — HEMOGLOBIN A1C: Hemoglobin A1C: 11.6

## 2020-01-20 ENCOUNTER — Encounter: Payer: Self-pay | Admitting: "Endocrinology

## 2020-01-20 ENCOUNTER — Other Ambulatory Visit: Payer: Self-pay

## 2020-01-20 ENCOUNTER — Ambulatory Visit (INDEPENDENT_AMBULATORY_CARE_PROVIDER_SITE_OTHER): Payer: Medicaid Other | Admitting: "Endocrinology

## 2020-01-20 VITALS — BP 114/76 | HR 81 | Ht 60.0 in | Wt 166.2 lb

## 2020-01-20 DIAGNOSIS — E1165 Type 2 diabetes mellitus with hyperglycemia: Secondary | ICD-10-CM | POA: Diagnosis not present

## 2020-01-20 DIAGNOSIS — E039 Hypothyroidism, unspecified: Secondary | ICD-10-CM | POA: Diagnosis not present

## 2020-01-20 MED ORDER — LEVEMIR FLEXTOUCH 100 UNIT/ML ~~LOC~~ SOPN: 50.0000 [IU] | PEN_INJECTOR | Freq: Every day | SUBCUTANEOUS | 2 refills | Status: DC

## 2020-01-20 NOTE — Progress Notes (Signed)
Endocrinology follow-up  Note       01/20/2020, 12:35 PM   Subjective:    Patient ID: Amber Henry, female    DOB: 07-03-1991.  Amber Henry is being seen in follow-up  for management of currently uncontrolled symptomatic type 2 diabetes, hypothyroidism. Pregnant with her first child at 6 weeks of gestation.   MD: Suzan Slick, MD.   Past Medical History:  Diagnosis Date  . ADHD (attention deficit hyperactivity disorder)   . Anxiety   . Asthma   . Bipolar 1 disorder (HCC)   . Borderline diabetes   . DDD (degenerative disc disease), lumbar   . Depression   . Diabetes mellitus without complication (HCC)   . Fibromyalgia    Past Surgical History:  Procedure Laterality Date  . CHOLECYSTECTOMY     Social History   Socioeconomic History  . Marital status: Legally Separated    Spouse name: Not on file  . Number of children: Not on file  . Years of education: Not on file  . Highest education level: Not on file  Occupational History  . Not on file  Tobacco Use  . Smoking status: Former Smoker    Types: Cigarettes  . Smokeless tobacco: Never Used  Substance and Sexual Activity  . Alcohol use: No    Comment: occ  . Drug use: No  . Sexual activity: Yes    Birth control/protection: None  Other Topics Concern  . Not on file  Social History Narrative  . Not on file   Social Determinants of Health   Financial Resource Strain:   . Difficulty of Paying Living Expenses:   Food Insecurity:   . Worried About Programme researcher, broadcasting/film/video in the Last Year:   . Barista in the Last Year:   Transportation Needs:   . Freight forwarder (Medical):   Marland Kitchen Lack of Transportation (Non-Medical):   Physical Activity:   . Days of Exercise per Week:   . Minutes of Exercise per Session:   Stress:   . Feeling of Stress :   Social Connections:   . Frequency of Communication with Friends and  Family:   . Frequency of Social Gatherings with Friends and Family:   . Attends Religious Services:   . Active Member of Clubs or Organizations:   . Attends Banker Meetings:   Marland Kitchen Marital Status:    Outpatient Encounter Medications as of 01/20/2020  Medication Sig  . cyclobenzaprine (FLEXERIL) 10 MG tablet Take 1 tablet by mouth 2 (two) times daily. Take one tablet two times daily as needed  . lamoTRIgine (LAMICTAL) 25 MG tablet 1 tablet 2 (two) times daily.  . Prenat w/o A-FeCbGl-DSS-FA-DHA (FOCALGIN 90 DHA) 90-1 & 300 MG MISC Take 1 Package by mouth daily.  . insulin aspart (NOVOLOG FLEXPEN) 100 UNIT/ML FlexPen Inject 15-21 Units into the skin 3 (three) times daily before meals.  . insulin detemir (LEVEMIR FLEXTOUCH) 100 UNIT/ML FlexPen Inject 50 Units into the skin daily at 10 pm.  . levothyroxine (SYNTHROID, LEVOTHROID) 88 MCG tablet Take by mouth daily.  . Multiple Vitamin (MULTI-VITAMINS)  TABS Take by mouth.  . [DISCONTINUED] ARIPiprazole (ABILIFY) 15 MG tablet Take by mouth daily.  . [DISCONTINUED] CASANTHRANOL-DOCUSATE SODIUM PO Take 1 tablet by mouth 2 (two) times daily.  . [DISCONTINUED] clotrimazole-betamethasone (LOTRISONE) cream Apply to affected area 2 times daily for one week  . [DISCONTINUED] famciclovir (FAMVIR) 500 MG tablet Take 1 tablet (500 mg total) by mouth 3 (three) times daily.  . [DISCONTINUED] fluconazole (DIFLUCAN) 200 MG tablet Take 1 tablet (200 mg total) by mouth daily.  . [DISCONTINUED] hydrOXYzine (VISTARIL) 100 MG capsule Take 1 tablet by mouth 2 (two) times daily.  . [DISCONTINUED] ibuprofen (ADVIL) 800 MG tablet Take 1 tablet (800 mg total) by mouth every 6 (six) hours as needed for moderate pain.  . [DISCONTINUED] ibuprofen (ADVIL) 800 MG tablet Take 1 tablet (800 mg total) by mouth every 6 (six) hours as needed for moderate pain.  . [DISCONTINUED] Insulin Glargine (LANTUS SOLOSTAR) 100 UNIT/ML Solostar Pen Inject 40 Units into the skin daily at  10 pm.  . [DISCONTINUED] LYRICA 150 MG capsule Take 300 mg by mouth 3 (three) times daily.   . [DISCONTINUED] meclizine (ANTIVERT) 25 MG tablet Take 1 tablet by mouth as needed for dizziness.   . [DISCONTINUED] medroxyPROGESTERone (DEPO-PROVERA) 150 MG/ML injection Inject into the muscle.  . [DISCONTINUED] metFORMIN (GLUCOPHAGE) 500 MG tablet Take 1 tablet (500 mg total) by mouth 2 (two) times daily with a meal.  . [DISCONTINUED] pregabalin (LYRICA) 75 MG capsule Take by mouth 2 (two) times daily.  . [DISCONTINUED] tiZANidine (ZANAFLEX) 4 MG tablet Take 4 mg by mouth every 8 (eight) hours as needed.   No facility-administered encounter medications on file as of 01/20/2020.    ALLERGIES: Allergies  Allergen Reactions  . Peanut Butter Flavor Shortness Of Breath  . Sulfamethoxazole-Trimethoprim Hives and Shortness Of Breath  . Amoxicillin Nausea And Vomiting  . Bactrim Hives  . Lithium   . Other     Spandex gives patient rash Spandex gives patient rash   . Penicillins Nausea And Vomiting  . Ceclor [Cefaclor] Rash  . Latex Rash    Any latex over 5%    VACCINATION STATUS:  There is no immunization history on file for this patient.  Diabetes She presents for her follow-up diabetic visit. She has type 2 diabetes mellitus. Onset time: She was diagnosed with type 2 diabetes at age 53, with A1c of 13%. Her disease course has been worsening (She was seen in October 2019 in the clinic, was given a plan with multiple daily injection of insulin and her A1c was 12.7%.  She did not return for follow-up since then.  She is sent back by OB/GYN due to her pregnancy this time.  Her previsit A1c is stil). There are no hypoglycemic associated symptoms. Pertinent negatives for hypoglycemia include no confusion, headaches, pallor or seizures. Associated symptoms include fatigue, polydipsia and polyuria. Pertinent negatives for diabetes include no chest pain and no polyphagia. There are no hypoglycemic  complications. Symptoms are worsening. There are no diabetic complications. Risk factors for coronary artery disease include diabetes mellitus, sedentary lifestyle, tobacco exposure, obesity and family history. Current diabetic treatment includes insulin injections (She was initiated on Lantus 20 units nightly.). Her weight is decreasing steadily. She is following a generally unhealthy diet. When asked about meal planning, she reported none. She has not had a previous visit with a dietitian. She never participates in exercise. Her home blood glucose trend is increasing steadily. Her overall blood glucose range is >200 mg/dl. (  She missed her appointment since October 2019.  Returns only because she is urged to by her OB/GYN providers due to her pregnancy at 6 weeks of gestation.  Her recent A1c is still high at 11.6%.  Her prior 2 A1c measurements were 12.7%, 13%.  She brought in meter showing only 6 readings in the last 30 days averaging 370.  She is minimally symptomatic of hyperglycemia. ) An ACE inhibitor/angiotensin II receptor blocker is not being taken. She does not see a podiatrist.Eye exam is not current.     Review of Systems  Constitutional: Positive for fatigue. Negative for chills, fever and unexpected weight change.  HENT: Negative for trouble swallowing and voice change.   Eyes: Negative for visual disturbance.  Respiratory: Negative for cough, shortness of breath and wheezing.   Cardiovascular: Negative for chest pain, palpitations and leg swelling.  Gastrointestinal: Negative for diarrhea, nausea and vomiting.  Endocrine: Positive for polydipsia and polyuria. Negative for cold intolerance, heat intolerance and polyphagia.  Musculoskeletal: Negative for arthralgias and myalgias.  Skin: Negative for color change, pallor, rash and wound.  Neurological: Negative for seizures and headaches.  Psychiatric/Behavioral: Negative for confusion and suicidal ideas.    Objective:    BP 114/76    Pulse 81   Ht 5' (1.524 m)   Wt 166 lb 3.2 oz (75.4 kg)   BMI 32.46 kg/m   Wt Readings from Last 3 Encounters:  01/20/20 166 lb 3.2 oz (75.4 kg)  10/27/19 160 lb (72.6 kg)  10/20/19 163 lb (73.9 kg)     Physical Exam Constitutional:      Appearance: She is well-developed.  HENT:     Head: Normocephalic and atraumatic.  Neck:     Thyroid: No thyromegaly.     Trachea: No tracheal deviation.  Cardiovascular:     Rate and Rhythm: Normal rate and regular rhythm.  Pulmonary:     Effort: Pulmonary effort is normal.     Breath sounds: Normal breath sounds.  Abdominal:     General: Bowel sounds are normal.     Palpations: Abdomen is soft.     Tenderness: There is no abdominal tenderness. There is no guarding.  Musculoskeletal:        General: Normal range of motion.     Cervical back: Normal range of motion and neck supple.  Skin:    General: Skin is warm and dry.     Coloration: Skin is not pale.     Findings: No erythema or rash.  Neurological:     Mental Status: She is alert and oriented to person, place, and time.     Cranial Nerves: No cranial nerve deficit.     Coordination: Coordination normal.     Deep Tendon Reflexes: Reflexes are normal and symmetric.     CMP ( most recent) CMP     Component Value Date/Time   NA 133 (L) 06/15/2018 1536   K 4.1 06/15/2018 1536   CL 99 06/15/2018 1536   CO2 23 06/15/2018 1536   GLUCOSE 494 (H) 06/15/2018 1536   BUN 11 01/06/2020 0000   CREATININE 0.5 01/06/2020 0000   CREATININE 0.94 06/15/2018 1536   CALCIUM 9.8 06/15/2018 1536   PROT 7.1 06/15/2018 1536   ALBUMIN 4.0 02/20/2010 1607   AST 25 06/15/2018 1536   ALT 36 (H) 06/15/2018 1536   ALKPHOS 61 02/20/2010 1607   BILITOT 0.6 06/15/2018 1536     Diabetic Labs (most recent): Lab Results  Component Value Date  HGBA1C 11.6 01/11/2020   HGBA1C 12.7 (H) 06/15/2018   HGBA1C 13 12/03/2017     Lab Results  Component Value Date   TSH 4.93 08/15/2019   TSH 2.63  06/15/2018   TSH 2.79 12/03/2017   TSH 1.66 02/09/2010   FREET4 1.4 06/15/2018     Assessment & Plan:   1. Uncontrolled type 2 diabetes mellitus with hyperglycemia ([place])  - Amber Henry has currently uncontrolled symptomatic type 2 DM since 29 years of age.    She missed her appointment since October 2019.  Returns only because she is urged to by her OB/GYN providers due to her pregnancy at 6 weeks of gestation.  Her recent A1c is still high at 11.6%.  Her prior 2 A1c measurements were 12.7%, 13%.  She brought in meter showing only 6 readings in the last 30 days averaging 370.  She is not symptomatic of hyperglycemia.   -her diabetes is complicated by her history of alarming noncompliance/ nonadherence,  multiple mood disorders, and Amber Henry remains at a high risk for more acute and chronic complications which include CAD, CVA, CKD, retinopathy, and neuropathy. These are all discussed in detail with the patient.  -I had a long discussion with her about the effects of uncontrolled diabetes on her fetus, over the course of her pregnancy, in labor.  - I have counseled her on diet management  by adopting a carbohydrate restricted/protein rich diet.   -  Suggestion is made for her to avoid simple carbohydrates  from her diet including Cakes, Sweet Desserts / Pastries, Ice Cream, Soda (diet and regular), Sweet Tea, Candies, Chips, Cookies, Sweet Pastries,  Store Bought Juices, Alcohol in Excess of  1-2 drinks a day, Artificial Sweeteners, Coffee Creamer, and "Sugar-free" Products. This will help patient to have stable blood glucose profile and potentially avoid unintended weight gain.   - I encouraged her to switch to  unprocessed or minimally processed complex starch and increased protein intake (animal or plant source), fruits, and vegetables.  - she is advised to stick to a routine mealtimes to eat 3 meals  a day and avoid unnecessary snacks ( to snack only to correct  hypoglycemia).   - she has missed her appointment with CDE  Jearld Fenton, RDN, CDE for individualized diabetes education.  We will arrange for referral again.  - I have approached her with the following individualized plan to manage diabetes and patient agrees:   -Based on her current and prevailing glycemic burden with A1c of 11.6% during first trimester pregnancy, she is approached again for proper engagement for intensive treatment with basal/bolus insulin.  Her she is high risk pregnancy due to severely uncontrolled diabetes before and after conception.  She is urged to maintain close follow-up with her OB/GYN providers.  -She has questionable compliance and engagement.  First priority would be to avoid inadvertent hypoglycemia.   -I discussed and switched her basal insulin Lantus to Levemir and increase to 50 units nightly, increase her NovoLog to 15-21  units 3 times daily AC for pre-meal blood glucose readings of 90 mg/dL or above.  She is urged to target monitoring blood glucose before each meal, 2 hours after each meal, as well as at bedtime and return in 10 to 15 days for reevaluation.    -Pregnancy related Glucose targets of 60-90 before meals and 120 hours after meals were discussed with her.  -She is warned not to take insulin without proper monitoring of blood glucose. -Patient  is encouraged to call clinic for blood glucose levels less than 70 or above 300 mg /dl. -She is advised to discontinue Metformin at this time.   -She will be managed exclusively with insulin during the course of her pregnancy. - Patient specific target  A1c;  LDL, HDL, Triglycerides,  were discussed in detail.  2) BP/HTN: Her blood pressure is controlled to target.  Her blood pressure is controlled to target.  She is not on any medications, advised to consider smoking cessation.  3) Lipids/HPL:   She does not have recent lipid panel to review, not on statins.  4)  Weight/Diet: CDE Consult will be  reinitiated , exercise, and detailed carbohydrates information provided.   5) hypothyroidism-long-standing diagnosis -She does not have recent thyroid function test to review, her last thyroid function test from November 2020 were consistent with appropriate replacement.  However, she will be sent to lab today for thyroid function test that she may need up titration of her levothyroxine to support her pregnancy.   There is a significant drop in her weight since last visit, current dose of levothyroxine at 88 mcg may as well be appropriate, and she is advised to continue the same dose until we review her new labs.    - We discussed about the correct intake of her thyroid hormone, on empty stomach at fasting, with water, separated by at least 30 minutes from breakfast and other medications,  and separated by more than 4 hours from calcium, iron, multivitamins, acid reflux medications (PPIs). -Patient is made aware of the fact that thyroid hormone replacement is needed for life, dose to be adjusted by periodic monitoring of thyroid function tests.   6) Chronic Care/Health Maintenance:  -she   is encouraged to continue to follow up with Ophthalmology, Dentist,  Podiatrist at least yearly or according to recommendations, and advised to stay away from  smoking. I have recommended yearly flu vaccine and pneumonia vaccination at least every 5 years; moderate intensity exercise for up to 150 minutes weekly; and  sleep for at least 7 hours a day.  - I advised patient to maintain close follow up with Suzan Slick, MD for primary care needs.  - Time spent on this patient care encounter:  45 min, of which > 50% was spent in  counseling and the rest reviewing her blood glucose logs , discussing her hypoglycemia and hyperglycemia episodes, reviewing her current and  previous labs / studies  ( including abstraction from other facilities) and medications  doses and developing a  long term treatment plan and  documenting her care.   Please refer to Patient Instructions for Blood Glucose Monitoring and Insulin/Medications Dosing Guide"  in media tab for additional information. Please  also refer to " Patient Self Inventory" in the Media  tab for reviewed elements of pertinent patient history.  Amber Henry participated in the discussions, expressed understanding, and voiced agreement with the above plans.  All questions were answered to her satisfaction. she is encouraged to contact clinic should she have any questions or concerns prior to her return visit.  Follow up plan: - Return in about 2 weeks (around 02/03/2020) for Labs Today- Non-Fasting Ok, Follow up with Pre-visit Labs.  Marquis Lunch, MD [facility] [address] [place], [place] [address] Phone: [phone]  Fax: [phone]    01/20/2020, 12:35 PM  This note was partially dictated with voice recognition software. Similar sounding words can be transcribed inadequately  or may not  be corrected upon review.

## 2020-01-21 LAB — COMPLETE METABOLIC PANEL WITH GFR
AG Ratio: 1.9 (calc) (ref 1.0–2.5)
ALT: 28 U/L (ref 6–29)
AST: 17 U/L (ref 10–30)
Albumin: 4.2 g/dL (ref 3.6–5.1)
Alkaline phosphatase (APISO): 72 U/L (ref 31–125)
BUN: 15 mg/dL (ref 7–25)
CO2: 26 mmol/L (ref 20–32)
Calcium: 9.8 mg/dL (ref 8.6–10.2)
Chloride: 101 mmol/L (ref 98–110)
Creat: 0.63 mg/dL (ref 0.50–1.10)
GFR, Est African American: 141 mL/min/{1.73_m2} (ref >=60)
GFR, Est Non African American: 122 mL/min/{1.73_m2} (ref >=60)
Globulin: 2.2 g/dL (calc) (ref 1.9–3.7)
Glucose, Bld: 315 mg/dL — ABNORMAL HIGH (ref 65–99)
Potassium: 5.1 mmol/L (ref 3.5–5.3)
Sodium: 139 mmol/L (ref 135–146)
Total Bilirubin: 0.6 mg/dL (ref 0.2–1.2)
Total Protein: 6.4 g/dL (ref 6.1–8.1)

## 2020-01-21 LAB — TSH: TSH: 4.26 mIU/L

## 2020-01-21 LAB — T4, FREE: Free T4: 1.3 ng/dL (ref 0.8–1.8)

## 2020-01-23 ENCOUNTER — Other Ambulatory Visit: Payer: Self-pay

## 2020-01-23 MED ORDER — NOVOLOG FLEXPEN 100 UNIT/ML ~~LOC~~ SOPN: 15.0000 [IU] | PEN_INJECTOR | Freq: Three times a day (TID) | SUBCUTANEOUS | 0 refills | Status: DC

## 2020-01-24 ENCOUNTER — Other Ambulatory Visit: Payer: Self-pay | Admitting: "Endocrinology

## 2020-01-24 MED ORDER — LEVOTHYROXINE SODIUM 100 MCG PO TABS: 100.0000 ug | ORAL_TABLET | Freq: Every day | ORAL | 2 refills | Status: DC

## 2020-02-09 ENCOUNTER — Ambulatory Visit: Payer: Medicaid Other | Admitting: "Endocrinology

## 2020-02-21 ENCOUNTER — Other Ambulatory Visit: Payer: Self-pay

## 2020-02-21 ENCOUNTER — Emergency Department (HOSPITAL_COMMUNITY)
Admission: EM | Admit: 2020-02-21 | Discharge: 2020-02-21 | Disposition: A | Discharge status: Home / Self Care | Payer: Medicaid Other | Attending: Emergency Medicine | Admitting: Emergency Medicine

## 2020-02-21 DIAGNOSIS — O24111 Pre-existing diabetes mellitus, type 2, in pregnancy, first trimester: Secondary | ICD-10-CM | POA: Insufficient documentation

## 2020-02-21 DIAGNOSIS — Z794 Long term (current) use of insulin: Secondary | ICD-10-CM | POA: Insufficient documentation

## 2020-02-21 DIAGNOSIS — E119 Type 2 diabetes mellitus without complications: Secondary | ICD-10-CM | POA: Insufficient documentation

## 2020-02-21 DIAGNOSIS — Z91040 Latex allergy status: Secondary | ICD-10-CM | POA: Diagnosis not present

## 2020-02-21 DIAGNOSIS — Z87891 Personal history of nicotine dependence: Secondary | ICD-10-CM | POA: Diagnosis not present

## 2020-02-21 DIAGNOSIS — Z79899 Other long term (current) drug therapy: Secondary | ICD-10-CM | POA: Diagnosis not present

## 2020-02-21 DIAGNOSIS — O039 Complete or unspecified spontaneous abortion without complication: Secondary | ICD-10-CM | POA: Insufficient documentation

## 2020-02-21 DIAGNOSIS — R1084 Generalized abdominal pain: Secondary | ICD-10-CM | POA: Insufficient documentation

## 2020-02-21 DIAGNOSIS — E039 Hypothyroidism, unspecified: Secondary | ICD-10-CM | POA: Diagnosis not present

## 2020-02-21 LAB — URINALYSIS, ROUTINE W REFLEX MICROSCOPIC
Bacteria, UA: NONE SEEN
Bilirubin Urine: NEGATIVE
Glucose, UA: >=500 mg/dL — AB
Ketones, ur: NEGATIVE mg/dL
Leukocytes,Ua: NEGATIVE
Nitrite: NEGATIVE
Protein, ur: NEGATIVE mg/dL
RBC / HPF: >50 RBC/hpf — ABNORMAL HIGH (ref 0–5)
Specific Gravity, Urine: 1.02 (ref 1.005–1.030)
pH: 5 (ref 5.0–8.0)

## 2020-02-21 LAB — HCG, QUANTITATIVE, PREGNANCY: hCG, Beta Chain, Quant, S: 338 m[IU]/mL — ABNORMAL HIGH (ref <5)

## 2020-02-21 MED ORDER — KETOROLAC TROMETHAMINE 30 MG/ML IJ SOLN
30.0000 mg | Freq: Once | INTRAMUSCULAR | Status: AC
  Administered 2020-02-21: 30 mg via INTRAMUSCULAR
  Filled 2020-02-21: qty 1

## 2020-02-21 NOTE — ED Provider Notes (Signed)
[facility] EMERGENCY DEPARTMENT Provider Note   CSN: [id number] Arrival date & time: 02/21/20  0258     History Chief Complaint  Patient presents with  . Abdominal Pain    Amber Henry is a 29 y.o. female.  HPI     This is a G2 P0 female who presents with abdominal pain.  Patient reports recent miscarriage.  She states that she passed products of conception Saturday.  She states tonight that she went to the bathroom and noted "a clot or something."  She states subsequently she developed intense crampy abdominal pain.  Pain does not lateralize.  It is all over her abdomen.  She states that it is 7 out of 10.  She did not take anything for the pain.  Denies urinary symptoms or fevers.  She has not been able to follow-up with OB/GYN to get repeat beta-hCG levels.  Patient chart reviewed.  She had a beta-hCG on April 28 of 3666 to confirm pregnancy.  Subsequently she had an ultrasound on 5/20 that showed a 7-week 1 day gestational sac with no cardiac activity.  This was deemed a nonviable pregnancy.  She had some continued abdominal discomfort and vaginal bleeding.  She was subsequently seen on 5/24 with a beta-hCG of 7137.  She reports passage of products on 02/18/2020.  She is O-.  Past Medical History:  Diagnosis Date  . ADHD (attention deficit hyperactivity disorder)   . Anxiety   . Asthma   . Bipolar 1 disorder (HCC)   . Borderline diabetes   . DDD (degenerative disc disease), lumbar   . Depression   . Diabetes mellitus without complication (HCC)   . Fibromyalgia     Patient Active Problem List   Diagnosis Date Noted  . Uncontrolled type 2 diabetes mellitus with hyperglycemia (HCC) 01/18/2018  . Hypothyroidism 01/18/2018  . Class 2 severe obesity due to excess calories with serious comorbidity and body mass index (BMI) of 35.0 to 35.9 in adult (HCC) 01/18/2018  . Current smoker 01/18/2018  . ILEUS 02/20/2010  . NAUSEA AND VOMITING 02/15/2010  . ABDOMINAL PAIN, UPPER  02/15/2010  . TRANSAMINASES, SERUM, ELEVATED 02/15/2010    Past Surgical History:  Procedure Laterality Date  . CHOLECYSTECTOMY       OB History   No obstetric history on file.     Family History  Problem Relation Age of Onset  . Cancer Mother        breast  . Kidney disease Mother        kidney failure  . Hypertension Other   . Heart disease Other   . Diabetes Other     Social History   Tobacco Use  . Smoking status: Former Smoker    Types: Cigarettes  . Smokeless tobacco: Never Used  Substance Use Topics  . Alcohol use: No    Comment: occ  . Drug use: No    Home Medications Prior to Admission medications   Medication Sig Start Date End Date Taking? Authorizing Provider  cyclobenzaprine (FLEXERIL) 10 MG tablet Take 1 tablet by mouth 2 (two) times daily. Take one tablet two times daily as needed 06/19/19   [provider]  insulin aspart (NOVOLOG FLEXPEN) 100 UNIT/ML FlexPen Inject 15-21 Units into the skin 3 (three) times daily before meals. 01/23/20   Roma Kayser, MD  insulin detemir (LEVEMIR FLEXTOUCH) 100 UNIT/ML FlexPen Inject 50 Units into the skin daily at 10 pm. 01/20/20   Nida, Denman George, MD  lamoTRIgine (LAMICTAL) 25 MG tablet 1 tablet 2 (two) times daily. 01/11/20   [provider]  levothyroxine (SYNTHROID) 100 MCG tablet Take 1 tablet (100 mcg total) by mouth daily before breakfast. 01/24/20 10/20/20  Roma Kayser, MD  Multiple Vitamin (MULTI-VITAMINS) TABS Take by mouth.    [provider]  Prenat w/o A-FeCbGl-DSS-FA-DHA (FOCALGIN 90 DHA) 90-1 & 300 MG MISC Take 1 Package by mouth daily. 01/11/20   [provider]    Allergies    Peanut butter flavor, Sulfamethoxazole-trimethoprim, Amoxicillin, Bactrim, Lithium, Other, Penicillins, Ceclor [cefaclor], and Latex  Review of Systems   Review of Systems  Respiratory: Negative for shortness of breath.   Cardiovascular: Negative for chest pain.    Gastrointestinal: Positive for abdominal pain. Negative for diarrhea, nausea and vomiting.  Genitourinary: Positive for vaginal bleeding. Negative for dysuria.  All other systems reviewed and are negative.   Physical Exam Updated Vital Signs BP 133/79 (BP Location: Left Arm)   Pulse 94   Temp 98.4 F (36.9 C) (Oral)   Resp 18   Ht 1.524 m (5')   Wt 72.6 kg   SpO2 99%   BMI 31.25 kg/m   Physical Exam Vitals and nursing note reviewed.  Constitutional:      Appearance: She is well-developed. She is obese. She is not ill-appearing.  HENT:     Head: Normocephalic and atraumatic.  Eyes:     Pupils: Pupils are equal, round, and reactive to light.  Cardiovascular:     Rate and Rhythm: Normal rate and regular rhythm.     Heart sounds: Normal heart sounds.  Pulmonary:     Effort: Pulmonary effort is normal. No respiratory distress.     Breath sounds: No wheezing.  Abdominal:     General: Bowel sounds are normal.     Palpations: Abdomen is soft.     Tenderness: There is no abdominal tenderness. There is no guarding or rebound.  Musculoskeletal:     Cervical back: Neck supple.  Skin:    General: Skin is warm and dry.  Neurological:     Mental Status: She is alert and oriented to person, place, and time.  Psychiatric:        Mood and Affect: Mood normal.     ED Results / Procedures / Treatments   Labs (all labs ordered are listed, but only abnormal results are displayed) Labs Reviewed  HCG, QUANTITATIVE, PREGNANCY - Abnormal; Notable for the following components:      Result Value   hCG, Beta Chain, Quant, S 338 (*)    All other components within normal limits  URINALYSIS, ROUTINE W REFLEX MICROSCOPIC    EKG None  Radiology No results found.  Procedures Procedures (including critical care time)  Medications Ordered in ED Medications  ketorolac (TORADOL) 30 MG/ML injection 30 mg (30 mg Intramuscular Given 02/21/20 0345)    ED Course  I have reviewed the  triage vital signs and the nursing notes.  Pertinent labs & imaging results that were available during my care of the patient were reviewed by me and considered in my medical decision making (see chart for details).    MDM Rules/Calculators/A&P                       Patient presents with the passage of clots and abdominal cramping.  She is overall nontoxic and vital signs are reassuring.  She has recently had a miscarriage and reports that she passed products  of conception on Saturday.  She had one episode of clot passage tonight and diffuse abdominal cramping.  The cramping did not lateralize.  Her abdominal exam is benign.  She has no signs of peritonitis.  She is hemodynamically stable.  I have reviewed her chart extensively.  She had an ultrasound on 5/20 at an outside facility that had an intrauterine pregnancy with no heart tones.  Additionally they commented that she had no masses or abnormal adnexal findings concerning for heterotopic pregnancy.  Have low suspicion for this.  She has been unable to follow-up to have trending of her beta-hCG.  Will repeat today.  Patient was given Toradol for cramping.  hCG today is 338.  This is reassuring.  On recheck, she states she feels much better and is not having any further abdominal pain.  Again low suspicious for ectopic or heterotopic pregnancy.  Recommend follow-up within the week for repeat beta-hCG to trend to 0.  Would expect that she would precipitously decline after passage of product.  Patient was given strict return precautions.  After history, exam, and medical workup I feel the patient has been appropriately medically screened and is safe for discharge home. Pertinent diagnoses were discussed with the patient. Patient was given return precautions.   Final Clinical Impression(s) / ED Diagnoses Final diagnoses:  Complete miscarriage    Rx / DC Orders ED Discharge Orders    None       Desteni Piscopo, Barbette Hair, MD 02/21/20 [phone]

## 2020-02-21 NOTE — ED Triage Notes (Signed)
Pt arrives to ED w/complaints of vaginal bleeding. Pt was diagnosed w/miscarriage on 6/4. Pt states she was in the bathtub 3 days ago when her placenta went down the drain, tonight a "clump" came out of her and she began to have severe abdominal pain.

## 2020-02-21 NOTE — Discharge Instructions (Addendum)
You were seen today for vaginal bleeding and abdominal cramping.  Your beta-hCG is trending down.  It is now 338.  You need to have this rerepeated within the next week to ensure complete resolution.  If you develop worsening pain or other symptoms, you should be reevaluated immediately.

## 2020-03-15 ENCOUNTER — Other Ambulatory Visit (HOSPITAL_COMMUNITY): Payer: Self-pay | Admitting: Neurology

## 2020-03-15 ENCOUNTER — Other Ambulatory Visit: Payer: Self-pay | Admitting: Neurology

## 2020-03-15 DIAGNOSIS — G35 Multiple sclerosis: Secondary | ICD-10-CM

## 2020-03-29 ENCOUNTER — Ambulatory Visit (HOSPITAL_COMMUNITY)
Admission: RE | Admit: 2020-03-29 | Discharge: 2020-03-29 | Disposition: A | Discharge status: Home / Self Care | Payer: Medicaid Other | Source: Ambulatory Visit | Attending: Neurology | Admitting: Neurology

## 2020-03-29 ENCOUNTER — Other Ambulatory Visit: Payer: Self-pay

## 2020-03-29 DIAGNOSIS — G35 Multiple sclerosis: Secondary | ICD-10-CM | POA: Insufficient documentation

## 2020-04-13 ENCOUNTER — Inpatient Hospital Stay (HOSPITAL_COMMUNITY)
Admission: AD | Admit: 2020-04-13 | Discharge: 2020-04-13 | Disposition: A | Discharge status: Home / Self Care | Payer: Medicaid Other | Attending: Obstetrics and Gynecology | Admitting: Obstetrics and Gynecology

## 2020-04-13 ENCOUNTER — Encounter (HOSPITAL_COMMUNITY): Payer: Self-pay | Admitting: Obstetrics and Gynecology

## 2020-04-13 DIAGNOSIS — F319 Bipolar disorder, unspecified: Secondary | ICD-10-CM | POA: Insufficient documentation

## 2020-04-13 DIAGNOSIS — M797 Fibromyalgia: Secondary | ICD-10-CM | POA: Insufficient documentation

## 2020-04-13 DIAGNOSIS — E119 Type 2 diabetes mellitus without complications: Secondary | ICD-10-CM | POA: Diagnosis not present

## 2020-04-13 DIAGNOSIS — Z79899 Other long term (current) drug therapy: Secondary | ICD-10-CM | POA: Insufficient documentation

## 2020-04-13 DIAGNOSIS — Z9049 Acquired absence of other specified parts of digestive tract: Secondary | ICD-10-CM | POA: Insufficient documentation

## 2020-04-13 DIAGNOSIS — R1030 Lower abdominal pain, unspecified: Secondary | ICD-10-CM | POA: Diagnosis present

## 2020-04-13 DIAGNOSIS — Z881 Allergy status to other antibiotic agents status: Secondary | ICD-10-CM | POA: Insufficient documentation

## 2020-04-13 DIAGNOSIS — Z87891 Personal history of nicotine dependence: Secondary | ICD-10-CM | POA: Diagnosis not present

## 2020-04-13 DIAGNOSIS — Z880 Allergy status to penicillin: Secondary | ICD-10-CM | POA: Diagnosis not present

## 2020-04-13 DIAGNOSIS — N946 Dysmenorrhea, unspecified: Secondary | ICD-10-CM

## 2020-04-13 DIAGNOSIS — Z794 Long term (current) use of insulin: Secondary | ICD-10-CM | POA: Diagnosis not present

## 2020-04-13 DIAGNOSIS — J45909 Unspecified asthma, uncomplicated: Secondary | ICD-10-CM | POA: Diagnosis not present

## 2020-04-13 LAB — POCT PREGNANCY, URINE: Preg Test, Ur: NEGATIVE

## 2020-04-13 NOTE — Discharge Instructions (Signed)
TAKE 800MG  OF IBUPROFEN 8 HRS FOR PAIN  Dysmenorrhea Dysmenorrhea means painful cramps during your period (menstrual period). You will have pain in your lower belly (abdomen). The pain is caused by the tightening (contracting) of the muscles of the womb (uterus). The pain may be mild or very bad. With this condition, you may:  Have a headache.  Feel sick to your stomach (nauseous).  Throw up (vomit).  Have lower back pain. Follow these instructions at home: Helping pain and cramping   Put heat on your lower back or belly when you have pain or cramps. Use the heat source that your doctor tells you to use. ? Place a towel between your skin and the heat. ? Leave the heat on for 20-30 minutes. ? Remove the heat if your skin turns bright red. This is especially important if you cannot feel pain, heat, or cold. ? Do not have a heating pad on during sleep.  Do aerobic exercises. These include walking, swimming, or biking. These may help with cramps.  Massage your lower back or belly. This may help lessen pain. General instructions  Take over-the-counter and prescription medicines only as told by your doctor.  Do not drive or use heavy machinery while taking prescription pain medicine.  Avoid alcohol and caffeine during and right before your period. These can make cramps worse.  Do not use any products that have nicotine or tobacco. These include cigarettes and e-cigarettes. If you need help quitting, ask your doctor.  Keep all follow-up visits as told by your doctor. This is important. Contact a doctor if:  You have pain that gets worse.  You have pain that does not get better with medicine.  You have pain during sex.  You feel sick to your stomach or you throw up during your period, and medicine does not help. Get help right away if:  You pass out (faint). Summary  Dysmenorrhea means painful cramps during your period (menstrual period).  Put heat on your lower back or  belly when you have pain or cramps.  Do exercises like walking, swimming, or biking to help with cramps.  Contact a doctor if you have pain during sex. This information is not intended to replace advice given to you by your health care provider. Make sure you discuss any questions you have with your health care provider. Document Revised: 08/14/2017 Document Reviewed: 09/18/2016 Elsevier Patient Education  2020 11/16/2016.

## 2020-04-13 NOTE — MAU Note (Signed)
.   Amber Henry is a 29 y.o.  here in MAU reporting: she has been having lower abdominal cramping since yesterday and went to [facility] for treatment. Pt reports a negative pregnancy test there which is confirmed in care everywhere. Pt states she is on her cycle now. Waited 5 hours in the ED and was never evaluated and left LMP: 04/11/20 Onset of complaint: yesterday Pain score: 10 Vitals:   04/13/20 1745  BP: (!) 156/91  Pulse: 67  Resp: 16  Temp: 98.7 F (37.1 C)  SpO2: 100%     FHT: Lab orders placed from triage: UPT

## 2020-04-13 NOTE — MAU Provider Note (Signed)
Chief Complaint: Abdominal Pain   First Provider Initiated Contact with Patient 04/13/20 1800      SUBJECTIVE HPI: [name] is a 29 y.o. G1P0010 who presents to MAU reporting lower abdominal cramping since 04/12/2020.  She was seen at [facility] for treatment, but left after waiting 5 hours in the emergency department and not evaluated.  She reports a negative pregnancy test at [facility].  She states she is now on her cycle; started April 11, 2020.  She reports she had a miscarriage at the end of May almost beginning of June 2021 and this is her first period since the miscarriage.  She reports she is bleeding heavy and passing some clots when she arrived to MAU.  Past Medical History:  Diagnosis Date  . ADHD (attention deficit hyperactivity disorder)   . Anxiety   . Asthma   . Bipolar 1 disorder (HCC)   . Borderline diabetes   . DDD (degenerative disc disease), lumbar   . Depression   . Diabetes mellitus without complication (HCC)   . Fibromyalgia    Past Surgical History:  Procedure Laterality Date  . CHOLECYSTECTOMY     Social History   Socioeconomic History  . Marital status: Legally Separated    Spouse name: Not on file  . Number of children: Not on file  . Years of education: Not on file  . Highest education level: Not on file  Occupational History  . Not on file  Tobacco Use  . Smoking status: Former Smoker    Types: Cigarettes  . Smokeless tobacco: Never Used  Vaping Use  . Vaping Use: Never used  Substance and Sexual Activity  . Alcohol use: No    Comment: occ  . Drug use: No  . Sexual activity: Yes    Birth control/protection: None  Other Topics Concern  . Not on file  Social History Narrative  . Not on file   Social Determinants of Health   Financial Resource Strain:   . Difficulty of Paying Living Expenses:   Food Insecurity:   . Worried About Programme researcher, broadcasting/film/video in the Last Year:   . Barista in the Last Year:    Transportation Needs:   . Freight forwarder (Medical):   Marland Kitchen Lack of Transportation (Non-Medical):   Physical Activity:   . Days of Exercise per Week:   . Minutes of Exercise per Session:   Stress:   . Feeling of Stress :   Social Connections:   . Frequency of Communication with Friends and Family:   . Frequency of Social Gatherings with Friends and Family:   . Attends Religious Services:   . Active Member of Clubs or Organizations:   . Attends Banker Meetings:   Marland Kitchen Marital Status:   Intimate Partner Violence:   . Fear of Current or Ex-Partner:   . Emotionally Abused:   Marland Kitchen Physically Abused:   . Sexually Abused:    No current facility-administered medications on file prior to encounter.   Current Outpatient Medications on File Prior to Encounter  Medication Sig Dispense Refill  . insulin aspart (NOVOLOG FLEXPEN) 100 UNIT/ML FlexPen Inject 15-21 Units into the skin 3 (three) times daily before meals. 15 mL 0  . insulin detemir (LEVEMIR FLEXTOUCH) 100 UNIT/ML FlexPen Inject 50 Units into the skin daily at 10 pm. 10 pen 2  . lamoTRIgine (LAMICTAL) 25 MG tablet 1 tablet 2 (two) times daily.    Marland Kitchen levothyroxine (  SYNTHROID) 100 MCG tablet Take 1 tablet (100 mcg total) by mouth daily before breakfast. 90 tablet 2  . cyclobenzaprine (FLEXERIL) 10 MG tablet Take 1 tablet by mouth 2 (two) times daily. Take one tablet two times daily as needed    . Multiple Vitamin (MULTI-VITAMINS) TABS Take by mouth.    . Prenat w/o A-FeCbGl-DSS-FA-DHA (FOCALGIN 90 DHA) 90-1 & 300 MG MISC Take 1 Package by mouth daily.     Allergies  Allergen Reactions  . Peanut Butter Flavor Shortness Of Breath  . Sulfamethoxazole-Trimethoprim Hives and Shortness Of Breath  . Amoxicillin Nausea And Vomiting  . Bactrim Hives  . Lithium   . Other     Spandex gives patient rash Spandex gives patient rash   . Penicillins Nausea And Vomiting  . Ceclor [Cefaclor] Rash  . Latex Rash    Any latex over 5%     ROS:  Review of Systems  Constitutional: Negative.   HENT: Negative.   Eyes: Negative.   Respiratory: Negative.   Cardiovascular: Negative.   Gastrointestinal: Negative.   Endocrine: Negative.   Genitourinary: Positive for pelvic pain (cramping really bad) and vaginal bleeding (heavy with clots).  Musculoskeletal: Negative.   Skin: Negative.   Allergic/Immunologic: Negative.   Neurological: Negative.   Hematological: Negative.   Psychiatric/Behavioral: Negative.     I have reviewed patient's Past Medical Hx, Surgical Hx, Family Hx, Social Hx, medications and allergies.   Physical Exam   Patient Vitals for the past 24 hrs:  BP Temp Pulse Resp SpO2  04/13/20 1745 (!) 156/91 98.7 F (37.1 C) 67 16 100 %   Physical Exam Vitals and nursing note reviewed.  Constitutional:      Appearance: She is well-developed.  HENT:     Head: Normocephalic and atraumatic.  Neurological:     Mental Status: She is alert.  Psychiatric:        Attention and Perception: Attention and perception normal.        Mood and Affect: Mood is anxious. Affect is tearful.        Behavior: Behavior is agitated.        Thought Content: Thought content normal.        Cognition and Memory: Cognition normal.        Judgment: Judgment normal.     MDM Patient denies any concerning symptoms in need of emergent evaluation. Patient advised that due to her not being pregnant, that MAU is not the appropriate place for her evaluation. Offered to escort her to [facility] or she could go home and try Ibuprofen 800 mg po every 8 hrs prn pain. Patient verbalized an understanding of the plan of care and agrees.   ASSESSMENT MSE Complete  PLAN Discharge patient at her request to seek non-emergent medical care elsewhere  Patient took her own Ibuprofen 800 mg prior to being discharged home per report of NT.  Raelyn Mora, CNM 04/13/2020 6:20 PM

## 2020-04-17 ENCOUNTER — Ambulatory Visit: Payer: Medicaid Other | Admitting: "Endocrinology

## 2021-11-12 IMAGING — MR MR CERVICAL SPINE W/O CM
5 series · 37 of 48 positions shown · non-contrast
Comparison: Prior head CT from 02/09/2013.

CLINICAL DATA: Initial evaluation for bilateral upper extremity
numbness for 3 months. Question multiple sclerosis.

EXAM:
MRI HEAD WITHOUT CONTRAST
MRI CERVICAL SPINE WITHOUT CONTRAST
TECHNIQUE: Multiplanar, multiecho pulse sequences of the brain and surrounding
structures, and cervical spine, to include the craniocervical
junction and cervicothoracic junction, were obtained without
intravenous contrast.

[Series 6: T1 · sagittal · 3.0mm · 0.69mm/px · 8 of 15 slices shown]
[im 1/15]
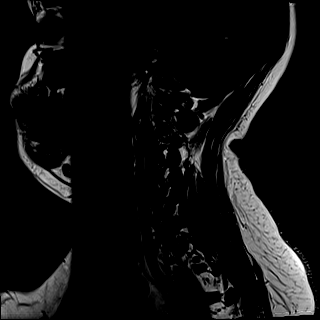
[im 3/15]
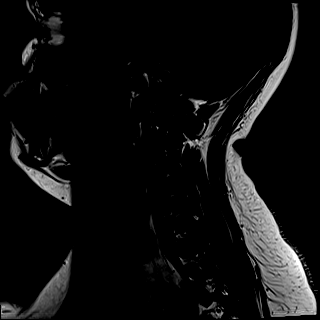
[im 5/15]
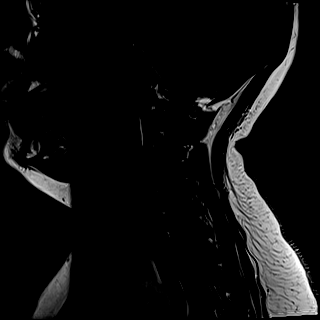
[im 7/15]
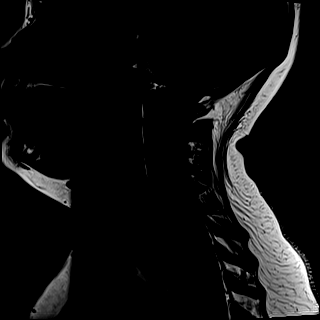
[im 9/15]
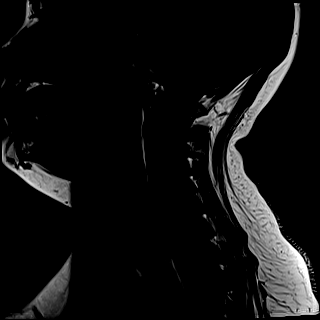
[im 11/15]
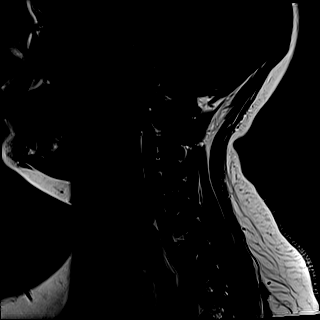
[im 13/15]
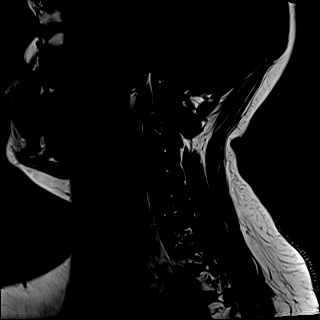
[im 15/15]
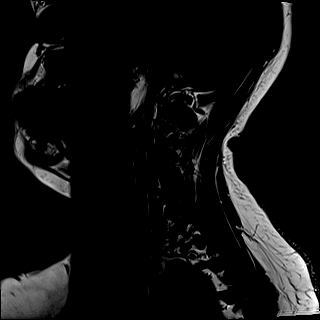

[Series 7: STIR · sagittal · 3.0mm · 0.86mm/px · 7 of 15 slices shown]
[im 1/15]
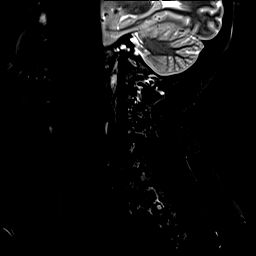
[im 3/15]
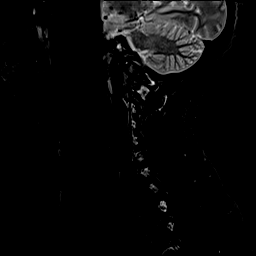
[im 5/15]
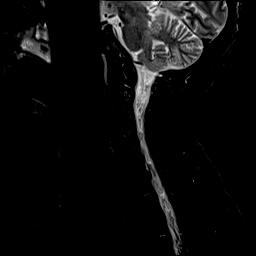
[im 8/15]
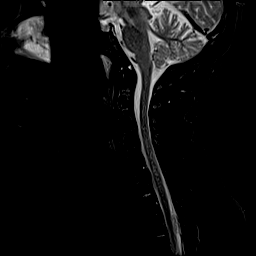
[im 10/15]
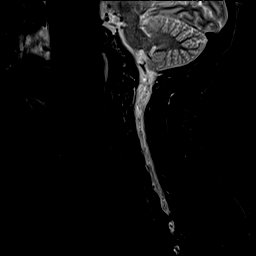
[im 12/15]
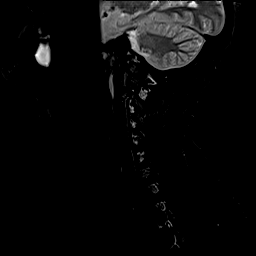
[im 15/15]
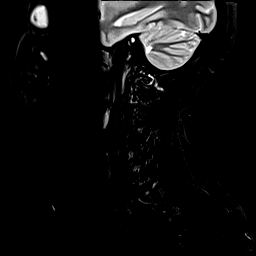

[Series 8: T2 · axial · 3.0mm · 0.70mm/px · z∈[-32,+54]mm · 9 of 27 slices shown (1 of 2)]
[im 1/27]
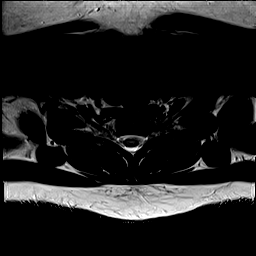
[im 5/27]
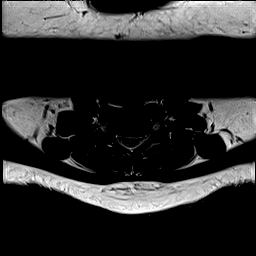
[im 9/27]
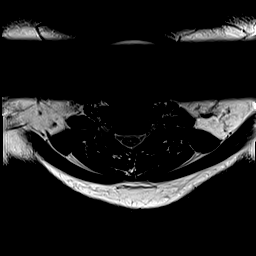
[im 11/27]
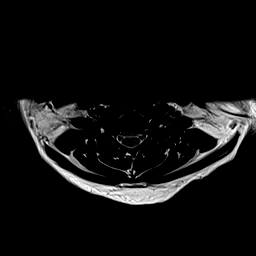
[im 14/27]
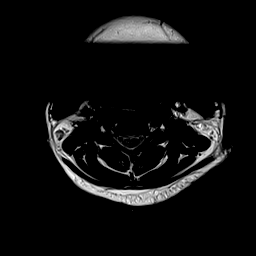
[im 16/27]
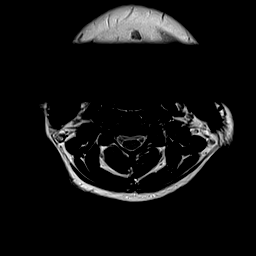
[im 18/27]
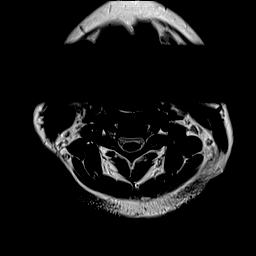
[im 22/27]
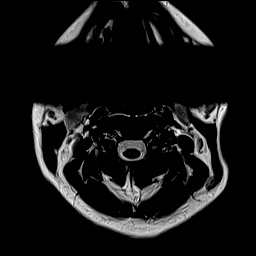
[im 27/27]
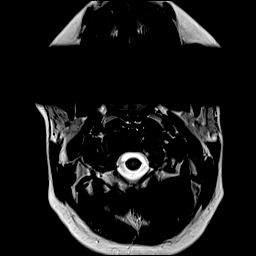

[Series 9: GRE · axial · 3.0mm · 0.35mm/px · z∈[-32,+24]mm · 6 of 27 slices shown]
[im 1/27]
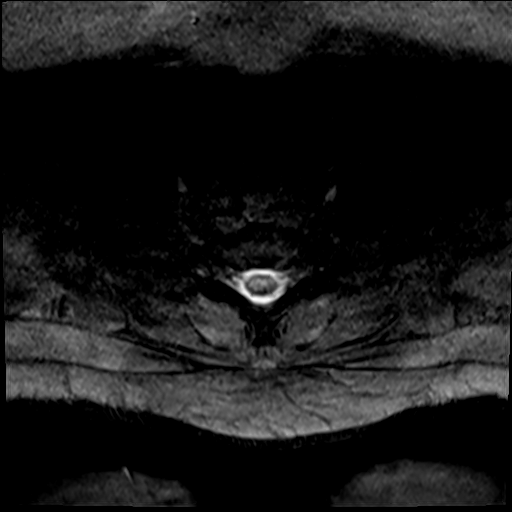
[im 5/27]
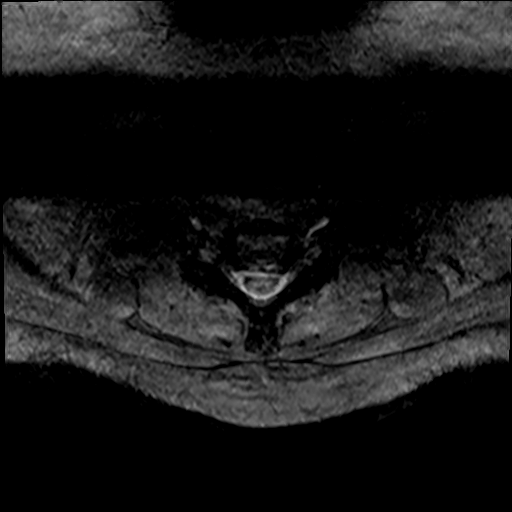
[im 9/27]
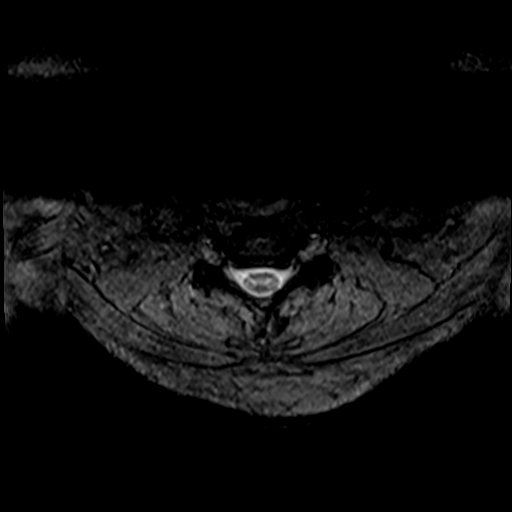
[im 11/27]
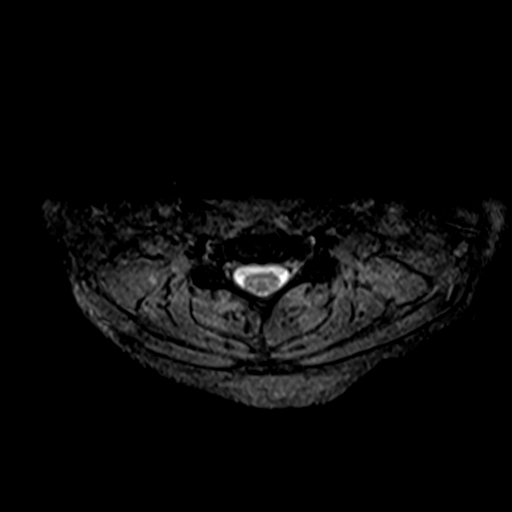
[im 16/27]
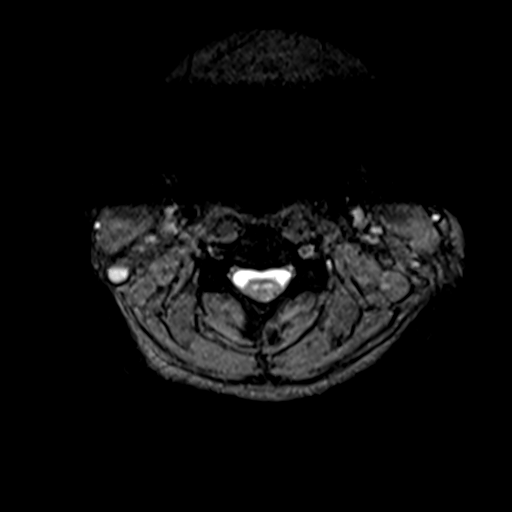
[im 18/27]
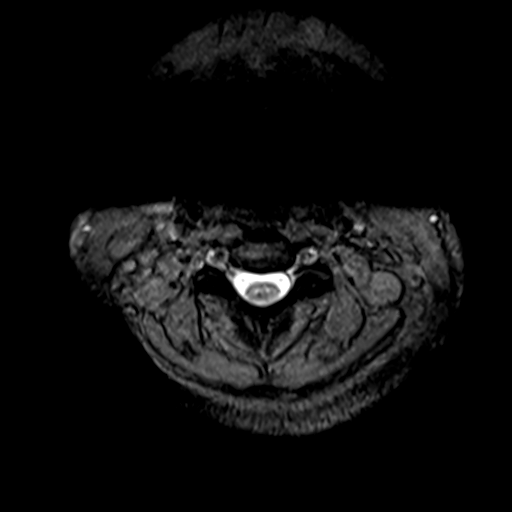

[Series 10: T2 · sagittal · 3.0mm · 0.69mm/px · 7 of 15 slices shown (2 of 2)]
[im 1/15]
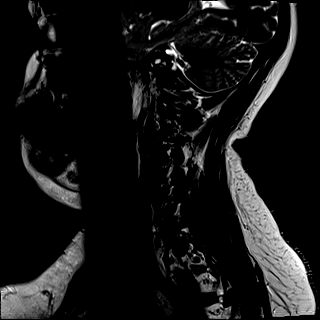
[im 3/15]
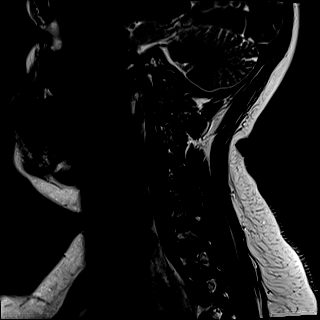
[im 5/15]
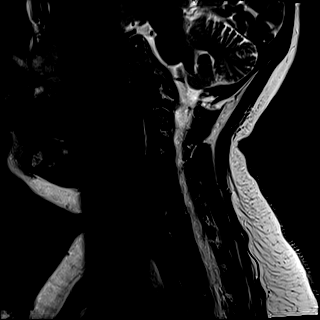
[im 8/15]
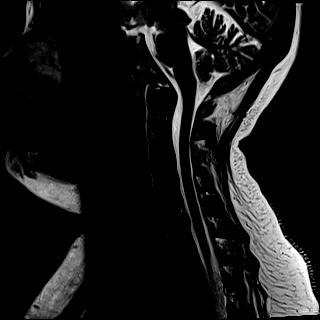
[im 10/15]
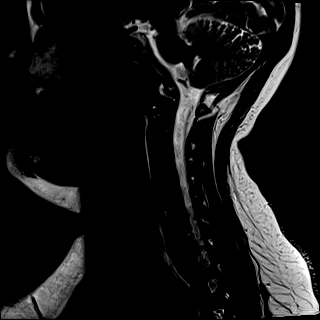
[im 12/15]
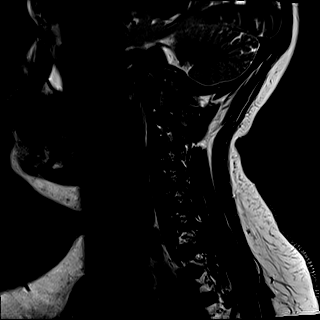
[im 15/15]
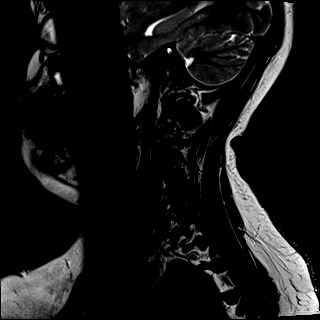

[37 of 48 positions shown; findings below may reference images not displayed]

FINDINGS: MRI HEAD FINDINGS

Brain: Cerebral volume within normal limits for patient age. No
focal parenchymal signal abnormality identified. No signal changes
to suggest demyelinating disease identified.

No abnormal foci of restricted diffusion to suggest acute or
subacute ischemia. Gray-white matter differentiation well
maintained. No encephalomalacia to suggest chronic infarction. No
foci of susceptibility artifact to suggest acute or chronic
intracranial hemorrhage.

No mass lesion, midline shift or mass effect. No hydrocephalus. No
extra-axial fluid collection. Major dural sinuses are grossly
patent.

Pituitary gland and suprasellar region are normal. Midline
structures intact and normal.

Vascular: Major intracranial vascular flow voids well maintained and
normal in appearance.

Skull and upper cervical spine: Craniocervical junction normal.
Visualized upper cervical spine within normal limits. Bone marrow
signal intensity normal. No scalp soft tissue abnormality.

Sinuses/Orbits: Globes and orbital soft tissues within normal
limits.

Right maxillary sinus retention cyst noted. Paranasal sinuses are
otherwise largely clear. No mastoid effusion. Inner ear structures
normal.

Other: None.

MRI CERVICAL SPINE FINDINGS

Alignment: Straightening of the normal cervical lordosis. No
listhesis or subluxation.

Vertebrae: Vertebral body height well maintained without evidence
for acute or chronic fracture. Bone marrow signal intensity somewhat
diffusely decreased on T1 weighted imaging, nonspecific, but most
commonly related to anemia, smoking, or obesity. No discrete or
worrisome osseous lesions. No abnormal marrow edema.

Cord: Signal intensity within the cervical spinal cord is normal.
Normal cord caliber morphology. No cord signal changes to suggest
demyelinating disease.

Posterior Fossa, vertebral arteries, paraspinal tissues:
Craniocervical junction normal. Paraspinous and prevertebral soft
tissues within normal limits. Normal flow voids seen within the
vertebral arteries bilaterally.

Disc levels:

C2-C3: Unremarkable.

C3-C4:  Minimal annular disc bulge.  No canal or foraminal stenosis.

C4-C5: Mild disc bulge with uncovertebral hypertrophy. No
significant spinal stenosis. No significant foraminal encroachment.

C5-C6: Mild disc bulge with uncovertebral hypertrophy. No
significant spinal stenosis. Foramina remain patent.

C6-C7: Shallow broad-based left paracentral disc protrusion flattens
and partially effaces the ventral thecal sac (series 8, image 22).
Lateral extension into the left neural foramen. No significant
spinal stenosis or cord deformity. The foramina remain patent.

C7-T1:  Unremarkable.

Visualized upper thoracic spine demonstrates no significant finding.
IMPRESSION: MRI HEAD IMPRESSION:

Normal brain MRI. No acute intracranial abnormality identified. No
findings to suggest demyelinating disease.

MRI CERVICAL SPINE IMPRESSION:

1. Normal MRI of the cervical spinal cord. No findings to suggest
demyelinating disease.
2. Shallow left paracentral disc protrusion at C6-7, potentially
affecting the ventral left C7 nerve root.
3. Additional mild noncompressive disc bulging at C3-4 through C5-6
without significant stenosis or neural impingement.

## 2022-01-08 ENCOUNTER — Encounter: Payer: Self-pay | Admitting: Dietician

## 2022-01-08 ENCOUNTER — Encounter: Payer: Medicaid Other | Attending: Physician Assistant | Admitting: Dietician

## 2022-01-08 DIAGNOSIS — E1165 Type 2 diabetes mellitus with hyperglycemia: Secondary | ICD-10-CM

## 2022-01-08 DIAGNOSIS — E119 Type 2 diabetes mellitus without complications: Secondary | ICD-10-CM | POA: Diagnosis present

## 2022-01-08 DIAGNOSIS — Z713 Dietary counseling and surveillance: Secondary | ICD-10-CM | POA: Insufficient documentation

## 2022-01-08 NOTE — Progress Notes (Signed)
Diabetes Self-Management Education ? ?Visit Type: First/Initial ? ?Appt. Start Time: 0800 Appt. End Time: 0815 ? ?01/08/2022 ? ?[name], identified by name and date of birth, is a 31 y.o. female with a diagnosis of Diabetes: Type 2.  ? ?ASSESSMENT ?Pt reports being in the ICU for a week with DKA at [facility] with an A1c of 11%, and CBG >269. Pt reports this was their first time in DKA. Pt states that they were 175 lbs when admitted to the ER, and came out around 125 lbs. Pt is currently 133 lbs. ?Pt reports a lowest A1c of 8% in the last year, but usually stays between 11 and 13% ?Pt reports family history of T2DM, parents had uncontrolled diabetes with many complications. Pt reports this has scared them into action to prevent future complications.  ?Pt reports burnout managing  their diabetes due to not knowing what to do and bad previous experiences with providers, including RD. ?Pt reports pain in their tongue as a symptom of hyperglycemia, but it has persisted since glucose control has improved. Reports no other symptoms like polydipsia, polyuria, or blurry vision. ?Pt reports early satiety that only allows them to eat a small meal once a day. Pt describes rapid onset nausea and may potentially vomit. Pt reports history of ED, anorexia and bulimia. ?Pt has food allergies to peanut butter, mushrooms, bleu cheese, or any fungus food. ?Pt reports exercising at the long term care facility when visiting their friend twice a week. Pt will lift weights, and do other low impact activities. Pt also walks their dog multiple times daily. ?Pt usually drinks water, and Sprite occasionally. ?There were no vitals taken for this visit. ?There is no height or weight on file to calculate BMI. ? ? Diabetes Self-Management Education - 01/08/22 0830   ? ?  ? Visit Information  ? Visit Type First/Initial   ?  ? Initial Visit  ? Diabetes Type Type 2   ? Date Diagnosed 2018   ? Are you currently following a meal plan?  No   ? Are you taking your medications as prescribed? Yes   ?  ? Health Coping  ? How would you rate your overall health? Poor   DKA  ?  ? Psychosocial Assessment  ? Patient Belief/Attitude about Diabetes Defeat/Burnout   ? What is the hardest part about your diabetes right now, causing you the most concern, or is the most worrisome to you about your diabetes?   Making healty food and beverage choices;Checking blood sugar   ? Self-management support None   ? Other persons present Patient   ? Patient Concerns Nutrition/Meal planning;Glycemic Control;Healthy Lifestyle   ? Special Needs None   ? Preferred Learning Style No preference indicated   ? Learning Readiness Contemplating   ? How often do you need to have someone help you when you read instructions, pamphlets, or other written materials from your doctor or pharmacy? 1 - Never   ? What is the last grade level you completed in school? 12th grade   ?  ? Pre-Education Assessment  ? Patient understands the diabetes disease and treatment process. Needs Instruction   ? Patient understands incorporating nutritional management into lifestyle. Needs Instruction   ? Patient undertands incorporating physical activity into lifestyle. Needs Instruction   ? Patient understands using medications safely. Needs Instruction   ? Patient understands monitoring blood glucose, interpreting and using results Needs Instruction   ? Patient understands prevention, detection, and treatment of  acute complications. Needs Instruction   ? Patient understands prevention, detection, and treatment of chronic complications. Needs Instruction   ? Patient understands how to develop strategies to address psychosocial issues. Needs Instruction   ? Patient understands how to develop strategies to promote health/change behavior. Needs Instruction   ?  ? Complications  ? Last HgB A1C per patient/outside source 11 %   11/05/2021  ? How often do you check your blood sugar? 3-4 times/day   ? Fasting Blood  glucose range (mg/dL) 552-080   ? Can you tell when your blood sugar is high? Yes   ? Number of hyperglycemic episodes ( >200mg /dL): Occasional   ? What do you do if your blood sugar is high? Nothing   ? Have you had a dilated eye exam in the past 12 months? No   ? Have you had a dental exam in the past 12 months? Yes   ? Are you checking your feet? Yes   ? How many days per week are you checking your feet? 3   ?  ? Dietary Intake  ? Breakfast None   ? Snack (morning) None   ? Lunch None   ? Snack (afternoon) None   ? Dinner Psychologist, prison and probation services and onion papa-dilla, parmesean crusted, Sprite   ? Snack (evening) None   ? Beverage(s) Water, Sprite   ?  ? Activity / Exercise  ? Activity / Exercise Type ADL's;Moderate (swimming / aerobic walking)   ? How many days per week to you exercise? 2   ? How many minutes per day do you exercise? 240   ? Total minutes per week of exercise 480   ?  ? Patient Education  ? Previous Diabetes Education Yes (please comment)   2021  ? Disease Pathophysiology Factors that contribute to the development of diabetes;Explored patient's options for treatment of their diabetes   ? Healthy Eating Plate Method;Role of diet in the treatment of diabetes and the relationship between the three main macronutrients and blood glucose level;Meal timing in regards to the patients' current diabetes medication.;Meal options for control of blood glucose level and chronic complications.   ? Being Active Role of exercise on diabetes management, blood pressure control and cardiac health.;Helped patient identify appropriate exercises in relation to his/her diabetes, diabetes complications and other health issue.   ? Medications Reviewed patients medication for diabetes, action, purpose, timing of dose and side effects.   ? Monitoring Taught/evaluated SMBG meter.;Identified appropriate SMBG and/or A1C goals.   ? Acute complications Taught prevention, symptoms, and  treatment of hypoglycemia - the 15 rule.;Discussed  and identified patients' prevention, symptoms, and treatment of hyperglycemia.   ? Chronic complications Relationship between chronic complications and blood glucose control;Assessed and discussed foot care and prevention of foot problems;Identified and discussed with patient  current chronic complications   ? Diabetes Stress and Support Role of stress on diabetes;Identified and addressed patients feelings and concerns about diabetes;Worked with patient to identify barriers to care and solutions;Brainstormed with patient on coping mechanisms for social situations, getting support from significant others, dealing with feelings about diabetes   ? Preconception care Reviewed with patient blood glucose goals with pregnancy   Pt reports history of GDM, discussed how that can affect future DM risk  ?  ? Individualized Goals (developed by patient)  ? Nutrition Follow meal plan discussed   ? Physical Activity Exercise 3-5 times per week   ? Medications take my medication as prescribed   ? Monitoring  Test my blood glucose as discussed   ? Problem Solving Sleep Pattern;Eating Pattern   Eating more than once a day  ? Reducing Risk examine blood glucose patterns;check ketones if blood glucose over 240mg /dL   ? Health Coping Ask for help with psychological, social, or emotional issues   ?  ? Post-Education Assessment  ? Patient understands the diabetes disease and treatment process. Needs Review   ? Patient understands incorporating nutritional management into lifestyle. Needs Review   ? Patient undertands incorporating physical activity into lifestyle. Comprehends key points   ? Patient understands using medications safely. Comphrehends key points   ? Patient understands monitoring blood glucose, interpreting and using results Needs Review   ? Patient understands prevention, detection, and treatment of acute complications. Comprehends key points   ? Patient understands prevention, detection, and treatment of chronic  complications. Needs Review   ? Patient understands how to develop strategies to address psychosocial issues. Needs Review   ? Patient understands how to develop strategies to promote health/change behavior. Needs

## 2022-01-08 NOTE — Patient Instructions (Signed)
Work towards eating three meals a day, about 5-6 hours apart! ? ?Begin to recognize carbohydrates, proteins, and non-starchy vegetables in your food choices! ? ?Begin to build your meals using the proportions of the Balanced Plate. ?First, select your carb choice(s) for the meal. Make this 25% of your meal. ?Next, select your source of protein to pair with your carb choice(s). Make this another 25% of your meal. ?Finally, complete your meal with a variety of non-starchy vegetables. Make this the remaining 50% of your meal. ? ?Check your blood sugar each morning before eating or drinking (fasting). ?Look for numbers under 125 mg/dL ?Check your blood sugar 2 hours after you begin eating a meal. ?Look for numbers under 180 mg/dL at all times. ?Your goal A1c is below 7.0% ? ? ?Prick on the side of the finger, near the tip beside your finger nail. If blood is slow to come out, massage finger in a downward motion to increase amount of blood.  ?If sample is too small doesn't come out at all, increase the depth number on your lancing device. ? ? ? ?

## 2022-01-21 ENCOUNTER — Ambulatory Visit: Payer: Medicaid Other | Admitting: Registered"

## 2022-01-29 ENCOUNTER — Encounter: Payer: Medicaid Other | Admitting: Registered"

## 2022-02-17 ENCOUNTER — Other Ambulatory Visit: Payer: Self-pay | Admitting: Neurology

## 2022-02-17 ENCOUNTER — Other Ambulatory Visit (HOSPITAL_COMMUNITY): Payer: Self-pay | Admitting: Neurology

## 2022-02-17 DIAGNOSIS — M461 Sacroiliitis, not elsewhere classified: Secondary | ICD-10-CM

## 2022-02-17 DIAGNOSIS — M5416 Radiculopathy, lumbar region: Secondary | ICD-10-CM

## 2022-02-26 ENCOUNTER — Ambulatory Visit: Payer: Medicaid Other | Admitting: Registered"

## 2022-03-05 ENCOUNTER — Other Ambulatory Visit: Payer: Self-pay | Admitting: Neurology

## 2022-03-05 ENCOUNTER — Ambulatory Visit
Admission: RE | Admit: 2022-03-05 | Discharge: 2022-03-05 | Disposition: A | Discharge status: Home / Self Care | Payer: Medicaid Other | Source: Ambulatory Visit | Attending: Neurology | Admitting: Neurology

## 2022-03-05 DIAGNOSIS — M461 Sacroiliitis, not elsewhere classified: Secondary | ICD-10-CM

## 2022-03-05 DIAGNOSIS — M5416 Radiculopathy, lumbar region: Secondary | ICD-10-CM

## 2022-03-05 MED ORDER — METHYLPREDNISOLONE ACETATE 40 MG/ML INJ SUSP (RADIOLOG
80.0000 mg | Freq: Once | INTRAMUSCULAR | Status: AC
  Administered 2022-03-05: 80 mg via INTRA_ARTICULAR

## 2022-03-05 MED ORDER — IOPAMIDOL (ISOVUE-M 200) INJECTION 41%
1.0000 mL | Freq: Once | INTRAMUSCULAR | Status: AC
  Administered 2022-03-05: 1 mL via INTRA_ARTICULAR

## 2022-03-13 ENCOUNTER — Ambulatory Visit
Admission: RE | Admit: 2022-03-13 | Discharge: 2022-03-13 | Disposition: A | Discharge status: Home / Self Care | Payer: Medicaid Other | Source: Ambulatory Visit | Attending: Neurology | Admitting: Neurology

## 2022-03-13 DIAGNOSIS — M5416 Radiculopathy, lumbar region: Secondary | ICD-10-CM

## 2022-03-28 ENCOUNTER — Encounter (INDEPENDENT_AMBULATORY_CARE_PROVIDER_SITE_OTHER): Payer: Self-pay

## 2023-12-10 ENCOUNTER — Other Ambulatory Visit: Payer: Self-pay | Admitting: Medical Genetics

## 2024-02-17 LAB — LAB REPORT - SCANNED
A1c: 10.5
EGFR: 113.2
TSH: 3.72 (ref 0.41–5.90)

## 2024-06-16 NOTE — Therapy (Signed)
 OUTPATIENT PHYSICAL THERAPY THORACOLUMBAR EVALUATION   Patient Name: Amber Henry MRN: [id number] DOB:04-01-1991, 33 y.o., female Today's Date: 06/17/2024  END OF SESSION:  PT End of Session - 06/17/24 1100     Visit Number 1    Number of Visits 12    Date for Recertification  07/29/24    Authorization Type [facility] Tailored Plan    Authorization Time Period seeking new auth    Activity Tolerance Patient tolerated treatment well;Patient limited by pain    Behavior During Therapy [facility] for tasks assessed/performed          Past Medical History:  Diagnosis Date   ADHD (attention deficit hyperactivity disorder)    Anxiety    Asthma    Bipolar 1 disorder (HCC)    Borderline diabetes    DDD (degenerative disc disease), lumbar    Depression    Diabetes mellitus without complication (HCC)    Fibromyalgia    Past Surgical History:  Procedure Laterality Date   CHOLECYSTECTOMY     Patient Active Problem List   Diagnosis Date Noted   Uncontrolled type 2 diabetes mellitus with hyperglycemia (HCC) 01/18/2018   Hypothyroidism 01/18/2018   Class 2 severe obesity due to excess calories with serious comorbidity and body mass index (BMI) of 35.0 to 35.9 in adult 01/18/2018   Current smoker 01/18/2018   ILEUS 02/20/2010   NAUSEA AND VOMITING 02/15/2010   ABDOMINAL PAIN, UPPER 02/15/2010   TRANSAMINASES, SERUM, ELEVATED 02/15/2010    PCP: Colette Torrence GRADE, MD  REFERRING PROVIDER: Trudy Vaughn FALCON, MD  REFERRING DIAG: M54.30 (ICD-10-CM) - Sciatica   Rationale for Evaluation and Treatment: Rehabilitation  THERAPY DIAG:  Pain in right hip  Sciatica of right side  Other low back pain  ONSET DATE: 3 yrs ago when she had her first kid  SUBJECTIVE:                                                                                                                                                                                           SUBJECTIVE STATEMENT: Started  when I had my kid 3 years ago. Has been hell since with her back. She can't have her kid at home without someone else there because of her pain. Once she had her hip pop back in the socket and felt no pain for a little bit which was the first time in a long time. But then it goes out and has been out still. Knees will go out on her when her hip is in socket which is why they prescribed me the wheelchair when I'm too weak to move. Not  able to drive without heat and massage on her back. R leg will shake with long periods of time.  PERTINENT HISTORY:  POTS; bipolar 1; ADHD; anxiety; DM  PAIN:  Are you having pain? Yes: NPRS scale: 8/10 Pain location: R hip down to the lateral side past her knee (not all the time) Pain description: stabbing in her socket with searing hot coals with her back; R hip  Aggravating factors: walking (, sitting  Relieving factors: R hip flexed and L LE extended; W sitting  PRECAUTIONS: Other: POTS (passes out)  RED FLAGS: None   WEIGHT BEARING RESTRICTIONS: No  FALLS:  Has patient fallen in last 6 months? No  LIVING ENVIRONMENT: Lives with: lives with their spouse Lives in: Other with ramp Stairs: No Has following equipment at home: Wheelchair (manual)  OCCUPATION: permanently disabled since she was 2 yrs    PLOF: Independent  PATIENT GOALS: Be able to bend and walk around and take care of her kid  NEXT MD VISIT: TBA  OBJECTIVE:  Note: Objective measures were completed at Evaluation unless otherwise noted.  DIAGNOSTIC FINDINGS: *None of recent past couple years*  03/13/2022 - MRI Lumbar spine w/o contract IMPRESSION: 1. Marrow edema within the pedicles on the right at L4 and L5, which may be degenerative and related to facet arthrosis, or may reflect stress reaction. 2. Marrow edema also present in the L5 pars interarticularis bilaterally. This may be degenerative and related to facet arthrosis, or may reflect stress reaction/developing  pars interarticularis defects. 3. At L4-L5, there is mild disc degeneration. Small disc bulge. Moderate facet arthrosis with ligamentum flavum hypertrophy (greater on the left). Mild relative left subarticular narrowing (without nerve root impingement). Mild left neural foraminal narrowing. 4. At L5-S1, there is mild disc degeneration. Small disc bulge. Mild facet arthrosis and ligamentum flavum hypertrophy. No significant spinal canal or foraminal stenosis. 5. Minimal facet arthrosis at L3-L4.  PATIENT SURVEYS:  Modified Oswestry Low Back Pain Disability Questionnaire: 43 / 50 = 86.0 %  COGNITION: Overall cognitive status: Within functional limits for tasks assessed     SENSATION: Reports deficits with R LE numbness/difficulty with   MUSCLE LENGTH: HS bilaterally tightness - R tightness more than L  POSTURE: rounded shoulders, forward head, and consistent fwd/bwd weight shifting noted  PALPATION: TTP max along R SIJ; piriformis TTP (R); guarding in B glut/hip rotators  Thoracolumbar paraspinals hypertrophy and TTP R ASIS > L ASIS  LUMBAR ROM:   AROM eval  Flexion 40% (*)  Extension 5% (*)   Right lateral flexion 10% (*)  Left lateral flexion 30% (*)  Right rotation 60%  Left rotation 60%   (Blank rows = not tested)  LOWER EXTREMITY MMT:     Active  LEFT eval RIGHT eval  Hip flexion 4 4- painful  Hip extension 3+ 3+ painful  Hip abduction 4- 4- painful  Hip adduction    Hip internal rotation    Hip external rotation    Knee flexion 4 4- painful  Knee extension 4 4-  Ankle dorsiflexion 4 4-  Ankle plantarflexion 4- 4-  Ankle inversion    Ankle eversion     (Blank rows = not tested)  LOWER EXTREMITY ROM:    MMT Right eval Left eval  Hip flexion Pain end range in low back   Hip extension    Hip abduction    Hip adduction    Hip internal rotation    Hip external rotation    Knee  flexion    Knee extension    Ankle dorsiflexion    Ankle  plantarflexion    Ankle inversion    Ankle eversion     (Blank rows = not tested)  LUMBAR SPECIAL TESTS: POSTIVE ON R side Straight leg raise test: Negative, Slump test: Positive, SI Compression/distraction test: Positive, and FABER test: Positive   FUNCTIONAL TESTS:  5 times sit to stand: 29s  2 minute walk test: NT - TBA   30s Sit to stand: 5 reps - increased pain from 8 to 9/10 after 30s therefore deferred further testing GAIT: Distance walked: 50' w/ antalgic lateral gait and decreased rotation throughout  Assistive device utilized: None Level of assistance: SBA and sign other next to her at all times Comments: reports she has to use the w/c sometimes secondary to fatigue and buckling in R LE > L LE  TREATMENT DATE:  06/17/24 Eval SIJ MET for improving alignment - pt reports improved tolerance to standing upon standing post performance of add and extension MET performance  Sciatic n glides with cues for end range decreased numbness   PATIENT EDUCATION:  Education details: Plan of care; improtance of continued progressions and mobility within tolerance  Person educated: Patient and significant other Education method: Medical illustrator Education comprehension: verbalized understanding and needs further education  HOME EXERCISE PROGRAM: Access Code: [facility] URL: [email or web address] Date: 06/17/2024 Prepared by: Lamarr Citrin  Exercises - Pelvic Muscle Energy Technique With Flexion and Extension  - 1 x daily - 7 x weekly - 1 sets - 8 reps - 5s hold - Pelvic Muscle Energy Technique With Adduction and Abduction  - 1 x daily - 7 x weekly - 1 sets - 8 reps - 5s hold - Supine Figure 4 Piriformis Stretch  - 1 x daily - 7 x weekly - 6 reps - 10s hold - Supine Sciatic Nerve Glide  - 3 x daily - 7 x weekly - 1 sets - 15 reps  ASSESSMENT:  CLINICAL IMPRESSION: Patient is a 33 y.o. female who was seen today for physical therapy evaluation and treatment  for sciatica on R side with hx of chronic back pain. Pt demonstrates decreased activity tolerance secondary to increased pain, decreased strength with R < L, misalignment in pelvis noted on objective measures, increased neural symptoms down R LE, and decreased tolerance to adl performance including transitions in sit to stand, stand to sit, gait, and functional bed mobility. Reports improved symptoms with intermittent pops, when lying on semi prone with R Lehip flexed and abducted and L LE extended as well as consistent massage/heat. Has attempted multiple pain relief techniques but does not take pain medications and seeking to have pain relief needed for ADL performance and safety as pt demonstrates and reports instability with gait and buckling in R LE. Plan to continue with skilled PT services to address functional deficits in activity, transitions and to improve pain tolerance for improved quality of life.   OBJECTIVE IMPAIRMENTS: Abnormal gait, decreased activity tolerance, decreased balance, decreased endurance, decreased mobility, difficulty walking, decreased ROM, decreased strength, increased fascial restrictions, impaired flexibility, postural dysfunction, and pain.   ACTIVITY LIMITATIONS: carrying, lifting, sitting, standing, squatting, sleeping, stairs, and bed mobility  PARTICIPATION LIMITATIONS: driving, shopping, and community activity  PERSONAL FACTORS: Time since onset of injury/illness/exacerbation and 1-2 comorbidities: anxiety, T2DM are also affecting patient's functional outcome.   REHAB POTENTIAL: Fair multiple co morbidities and failed conservative treatment prior  CLINICAL DECISION MAKING: Evolving/moderate complexity  EVALUATION COMPLEXITY:  Moderate   GOALS: Goals reviewed with patient? No  SHORT TERM GOALS: Target date: 07/08/24  Pt will be independent with HEP in order to demonstrate participation in Physical Therapy POC.  Baseline: Goal status: INITIAL  2.  Pt  will report 5/10 pain with mobility in order to demonstrate improved pain with ADLs.  Baseline:  Goal status: INITIAL  LONG TERM GOALS: Target date: 09/03/24  Pt will improve 30s sit to stand by 5 repetitions maintaining <5/10 pain in order to demonstrate improved functional strength to return to desired activities.  Baseline: see objective.  Goal status: INITIAL  2.  Pt will improve 2 MWT by 100' with <5/10 pain in order to demonstrate improved functional ambulatory capacity in community setting.  Baseline: see objective.  Goal status: INITIAL  3.  Pt will improve Modified Oswestry score by 6 points in order to demonstrate improved pain with functional goals and outcomes. Baseline: see objective.  Goal status: INITIAL  4.  Pt will report <4/10 pain with mobility in order to demonstrate reduced pain with ADLs lasting greater than 30 minutes.  Baseline: see objective.  Goal status: INITIAL   Baseline: Goal status: INITIAL  2.  Pt will report independence with caretaking of 52yr old son for at least 6 hours at a time in order to return to ADL independence and improved quality of life.  Baseline:  Goal status: INITIAL  PLAN:  PT FREQUENCY: 2x/week  PT DURATION: 6 weeks  PLANNED INTERVENTIONS: 97110-Therapeutic exercises, 97530- Therapeutic activity, [id number]- Neuromuscular re-education, 97535- Self Care, 02859- Manual therapy, [phone]- Gait training, Patient/Family education, Balance training, and Stair training.  PLAN FOR NEXT SESSION: reassess alignment / leg length; assess balance as tolerated; assess thoracic spine  Lamarr LITTIE Citrin PT, DPT [facility] [phone] office   Lamarr LITTIE Citrin, PT 14-Jul-2024, 11:04 AM    Managed Medicaid Authorization Request Treatment Start Date: 07-14-2024  Visit Dx Codes: M54.30 (ICD-10-CM) - Sciatica   Functional Tool Score: FTSTS 29s with increased pain  For all possible CPT codes, reference the  Planned Interventions line above.     Check all conditions that are expected to impact treatment: {Conditions expected to impact treatment:Diabetes mellitus and Psychological or psychiatric disorders   If treatment provided at initial evaluation, no treatment charged due to lack of authorization.

## 2024-06-17 ENCOUNTER — Other Ambulatory Visit: Payer: Self-pay

## 2024-06-17 ENCOUNTER — Encounter (HOSPITAL_COMMUNITY): Payer: Self-pay

## 2024-06-17 ENCOUNTER — Ambulatory Visit (HOSPITAL_COMMUNITY): Payer: MEDICAID | Attending: Internal Medicine

## 2024-06-17 DIAGNOSIS — M5459 Other low back pain: Secondary | ICD-10-CM | POA: Insufficient documentation

## 2024-06-17 DIAGNOSIS — M25551 Pain in right hip: Secondary | ICD-10-CM | POA: Diagnosis present

## 2024-06-17 DIAGNOSIS — M5431 Sciatica, right side: Secondary | ICD-10-CM | POA: Insufficient documentation

## 2024-06-22 ENCOUNTER — Ambulatory Visit (HOSPITAL_COMMUNITY): Payer: MEDICAID | Admitting: Physical Therapy

## 2024-06-22 DIAGNOSIS — M25551 Pain in right hip: Secondary | ICD-10-CM

## 2024-06-22 DIAGNOSIS — M5459 Other low back pain: Secondary | ICD-10-CM

## 2024-06-22 DIAGNOSIS — M5431 Sciatica, right side: Secondary | ICD-10-CM

## 2024-06-22 NOTE — Therapy (Signed)
 OUTPATIENT PHYSICAL THERAPY THORACOLUMBAR TREATMENT   Patient Name: Amber Henry MRN: [id number] DOB:1990-11-21, 33 y.o., female Today's Date: 06/22/2024  END OF SESSION:   PT End of Session - 06/22/24 1330     Visit Number 2    Number of Visits 12    Date for Recertification  07/29/24    Authorization Type [facility] Tailored Plan    Authorization Time Period seeking new auth    PT Start Time 1330    PT Stop Time 1413    PT Time Calculation (min) 43 min    Activity Tolerance Patient tolerated treatment well;Patient limited by pain    Behavior During Therapy WFL for tasks assessed/performed           Past Medical History:  Diagnosis Date   ADHD (attention deficit hyperactivity disorder)    Anxiety    Asthma    Bipolar 1 disorder (HCC)    Borderline diabetes    DDD (degenerative disc disease), lumbar    Depression    Diabetes mellitus without complication (HCC)    Fibromyalgia    Past Surgical History:  Procedure Laterality Date   CHOLECYSTECTOMY     Patient Active Problem List   Diagnosis Date Noted   Uncontrolled type 2 diabetes mellitus with hyperglycemia (HCC) 01/18/2018   Hypothyroidism 01/18/2018   Class 2 severe obesity due to excess calories with serious comorbidity and body mass index (BMI) of 35.0 to 35.9 in adult 01/18/2018   Current smoker 01/18/2018   ILEUS 02/20/2010   NAUSEA AND VOMITING 02/15/2010   ABDOMINAL PAIN, UPPER 02/15/2010   TRANSAMINASES, SERUM, ELEVATED 02/15/2010    PCP: Colette Torrence GRADE, MD  REFERRING PROVIDER: Trudy Vaughn FALCON, MD  REFERRING DIAG: M54.30 (ICD-10-CM) - Sciatica   Rationale for Evaluation and Treatment: Rehabilitation  THERAPY DIAG:  Pain in right hip  Sciatica of right side  Other low back pain  ONSET DATE: 3 yrs ago when she had her first kid  SUBJECTIVE:                                                                                                                                                                                            SUBJECTIVE STATEMENT: 10/8: pt returns today with significant other.  4/10 prior to which she states is her baseline but increased to 7/10 after 2 minute walk test.  Reports compliance with HEP.  States they walked 1/4-1/2 of [place].  Evaluation:  Started when I had my kid 3 years ago. Has been hell since with her back. She can't have her kid at home without someone  else there because of her pain. Once she had her hip pop back in the socket and felt no pain for a little bit which was the first time in a long time. But then it goes out and has been out still. Knees will go out on her when her hip is in socket which is why they prescribed me the wheelchair when I'm too weak to move. Not able to drive without heat and massage on her back. R leg will shake with long periods of time.  PERTINENT HISTORY:  POTS; bipolar 1; ADHD; anxiety; DM  PAIN:  Are you having pain? Yes: NPRS scale: 7/10 Pain location: R hip down to the lateral side past her knee (not all the time) Pain description: stabbing in her socket with searing hot coals with her back; R hip  Aggravating factors: walking (, sitting  Relieving factors: R hip flexed and L LE extended; W sitting  PRECAUTIONS: Other: POTS (passes out)  RED FLAGS: None   WEIGHT BEARING RESTRICTIONS: No  FALLS:  Has patient fallen in last 6 months? No  LIVING ENVIRONMENT: Lives with: lives with their spouse Lives in: Other with ramp Stairs: No Has following equipment at home: Wheelchair (manual)  OCCUPATION: permanently disabled since she was 2 yrs    PLOF: Independent  PATIENT GOALS: Be able to bend and walk around and take care of her kid  NEXT MD VISIT: TBA  OBJECTIVE:  Note: Objective measures were completed at Evaluation unless otherwise noted.  DIAGNOSTIC FINDINGS: *None of recent past couple years*  03/13/2022 - MRI Lumbar spine w/o contract IMPRESSION: 1.  Marrow edema within the pedicles on the right at L4 and L5, which may be degenerative and related to facet arthrosis, or may reflect stress reaction. 2. Marrow edema also present in the L5 pars interarticularis bilaterally. This may be degenerative and related to facet arthrosis, or may reflect stress reaction/developing pars interarticularis defects. 3. At L4-L5, there is mild disc degeneration. Small disc bulge. Moderate facet arthrosis with ligamentum flavum hypertrophy (greater on the left). Mild relative left subarticular narrowing (without nerve root impingement). Mild left neural foraminal narrowing. 4. At L5-S1, there is mild disc degeneration. Small disc bulge. Mild facet arthrosis and ligamentum flavum hypertrophy. No significant spinal canal or foraminal stenosis. 5. Minimal facet arthrosis at L3-L4.  PATIENT SURVEYS:  Modified Oswestry Low Back Pain Disability Questionnaire: 43 / 50 = 86.0 %  COGNITION: Overall cognitive status: Within functional limits for tasks assessed     SENSATION: Reports deficits with R LE numbness/difficulty with   MUSCLE LENGTH: HS bilaterally tightness - R tightness more than L  POSTURE: rounded shoulders, forward head, and consistent fwd/bwd weight shifting noted  PALPATION: TTP max along R SIJ; piriformis TTP (R); guarding in B glut/hip rotators  Thoracolumbar paraspinals hypertrophy and TTP R ASIS > L ASIS  LUMBAR ROM:   AROM eval  Flexion 40% (*)  Extension 5% (*)   Right lateral flexion 10% (*)  Left lateral flexion 30% (*)  Right rotation 60%  Left rotation 60%   (Blank rows = not tested)  LOWER EXTREMITY MMT:     Active  LEFT eval RIGHT eval  Hip flexion 4 4- painful  Hip extension 3+ 3+ painful  Hip abduction 4- 4- painful  Hip adduction    Hip internal rotation    Hip external rotation    Knee flexion 4 4- painful  Knee extension 4 4-  Ankle dorsiflexion 4 4-  Ankle plantarflexion  4- 4-  Ankle inversion     Ankle eversion     (Blank rows = not tested)  LOWER EXTREMITY ROM:    MMT Right eval Left eval  Hip flexion Pain end range in low back   Hip extension    Hip abduction    Hip adduction    Hip internal rotation    Hip external rotation    Knee flexion    Knee extension    Ankle dorsiflexion    Ankle plantarflexion    Ankle inversion    Ankle eversion     (Blank rows = not tested)  LUMBAR SPECIAL TESTS: POSTIVE ON R side Straight leg raise test: Negative, Slump test: Positive, SI Compression/distraction test: Positive, and FABER test: Positive   FUNCTIONAL TESTS:  5 times sit to stand: 29s  2 minute walk test: NT - TBA  10/8:  220 feet no AD   30s Sit to stand: 5 reps - increased pain from 8 to 9/10 after 30s therefore deferred further testing GAIT: Distance walked: 56' w/ antalgic lateral gait and decreased rotation throughout  Assistive device utilized: None Level of assistance: SBA and sign other next to her at all times Comments: reports she has to use the w/c sometimes secondary to fatigue and buckling in R LE > L LE  TREATMENT DATE:  06/22/24 220 feet no AD, holding back part of time due to pain SI joint check, MET for Rt anterior rotation Supine:  Rt single leg bridge 10X  SLR 10X each side  Lower trunk rotations 10X each side  Fig 4 stretch 3X20  Piriformis pull across stretch 3X20  hamstring stretch with strap 3X20 each  Sciatic nerve glide 10X each LE  Goal review   06/17/24 Eval SIJ MET for improving alignment - pt reports improved tolerance to standing upon standing post performance of add and extension MET performance  Sciatic n glides with cues for end range decreased numbness   PATIENT EDUCATION:  Education details: Plan of care; improtance of continued progressions and mobility within tolerance  Person educated: Patient and significant other Education method: Medical illustrator Education comprehension: verbalized understanding  and needs further education  HOME EXERCISE PROGRAM: Access Code: [facility] URL: [email or web address] Date: 06/17/2024 Prepared by: Lamarr Citrin  Exercises - Pelvic Muscle Energy Technique With Flexion and Extension  - 1 x daily - 7 x weekly - 1 sets - 8 reps - 5s hold - Pelvic Muscle Energy Technique With Adduction and Abduction  - 1 x daily - 7 x weekly - 1 sets - 8 reps - 5s hold - Supine Figure 4 Piriformis Stretch  - 1 x daily - 7 x weekly - 6 reps - 10s hold - Supine Sciatic Nerve Glide  - 3 x daily - 7 x weekly - 1 sets - 15 reps  ASSESSMENT:  CLINICAL IMPRESSION: Began session with in which she completed 220 feet.  Pt did walk slowly and cautiously and midway began clutching her lower back.  PT did not verbally c/o pain or stop activity due to pain.  SI joint checked for alignment with noted anterior rotation.   MET completed with pt reporting overall improvement following.  Pt verbalizes she has been completing Rt bridge and Lt SLR at home to help improve her alignment.  Reviewed goals in which pt was agreeable to.  Continued with focus on LE core recruitment and LE strengthening.   Instructed with sciatic nerve glide in supine today with  good results also. Pt will continue to benefit from skilled therapy to address functional deficits in activity, transitions and to improve pain tolerance for improved quality of life.   OBJECTIVE IMPAIRMENTS: Abnormal gait, decreased activity tolerance, decreased balance, decreased endurance, decreased mobility, difficulty walking, decreased ROM, decreased strength, increased fascial restrictions, impaired flexibility, postural dysfunction, and pain.   ACTIVITY LIMITATIONS: carrying, lifting, sitting, standing, squatting, sleeping, stairs, and bed mobility  PARTICIPATION LIMITATIONS: driving, shopping, and community activity  PERSONAL FACTORS: Time since onset of injury/illness/exacerbation and 1-2 comorbidities: anxiety, T2DM  are also affecting patient's functional outcome.   REHAB POTENTIAL: Fair multiple co morbidities and failed conservative treatment prior  CLINICAL DECISION MAKING: Evolving/moderate complexity  EVALUATION COMPLEXITY: Moderate   GOALS: Goals reviewed with patient? Yes  SHORT TERM GOALS: Target date: 07/08/24  Pt will be independent with HEP in order to demonstrate participation in Physical Therapy POC.  Baseline: Goal status: IN PROGRESS  2.  Pt will report 5/10 pain with mobility in order to demonstrate improved pain with ADLs.  Baseline:  Goal status: IN PROGRESS  LONG TERM GOALS: Target date: 09/03/24  Pt will improve 30s sit to stand by 5 repetitions maintaining <5/10 pain in order to demonstrate improved functional strength to return to desired activities.  Baseline: see objective.  Goal status: IN PROGRESS  2.  Pt will improve 2 MWT by 100' with <5/10 pain in order to demonstrate improved functional ambulatory capacity in community setting.  Baseline: see objective.  Goal status: IN PROGRESS  3.  Pt will improve Modified Oswestry score by 6 points in order to demonstrate improved pain with functional goals and outcomes. Baseline: see objective.  Goal status: IN PROGRESS  4.  Pt will report <4/10 pain with mobility in order to demonstrate reduced pain with ADLs lasting greater than 30 minutes.  Baseline: see objective.  Goal status: IN PROGRESS   5.  Pt will report independence with caretaking of 67yr old son for at least 6 hours at a time in order to return to ADL independence and improved quality of life.  Baseline:  Goal status: IN PROGRESS  PLAN:  PT FREQUENCY: 2x/week  PT DURATION: 6 weeks  PLANNED INTERVENTIONS: 97110-Therapeutic exercises, 97530- Therapeutic activity, [id number]- Neuromuscular re-education, 97535- Self Care, 02859- Manual therapy, [phone]- Gait training, Patient/Family education, Balance training, and Stair training.  PLAN FOR NEXT SESSION:  reassess alignment of SIJ each visit.  Progress to balance activities.   Greig KATHEE Fuse, PTA/CLT [facility] Ph: [phone]   Fuse Greig KATHEE, PTA 06/22/2024, 2:15 PM

## 2024-07-08 ENCOUNTER — Telehealth (HOSPITAL_COMMUNITY): Payer: Self-pay

## 2024-07-08 ENCOUNTER — Ambulatory Visit (HOSPITAL_COMMUNITY): Payer: MEDICAID

## 2024-07-08 NOTE — Telephone Encounter (Signed)
 No show, called and spoke to pt who stated family emergency. Reminded next apt date and time and requested pt to call if unable to make to next apt with contact number given.   Augustin Mclean, LPTA/CLT; [facility] [phone]

## 2024-07-12 ENCOUNTER — Other Ambulatory Visit: Payer: Self-pay | Admitting: Medical Genetics

## 2024-07-12 DIAGNOSIS — Z006 Encounter for examination for normal comparison and control in clinical research program: Secondary | ICD-10-CM

## 2024-07-13 ENCOUNTER — Ambulatory Visit (HOSPITAL_COMMUNITY): Payer: MEDICAID | Admitting: Physical Therapy

## 2024-07-13 DIAGNOSIS — M25551 Pain in right hip: Secondary | ICD-10-CM | POA: Diagnosis not present

## 2024-07-13 DIAGNOSIS — M5459 Other low back pain: Secondary | ICD-10-CM

## 2024-07-13 DIAGNOSIS — M5431 Sciatica, right side: Secondary | ICD-10-CM

## 2024-07-13 NOTE — Therapy (Signed)
 OUTPATIENT PHYSICAL THERAPY THORACOLUMBAR TREATMENT   Patient Name: Amber Henry MRN: [id number] DOB:1991/07/06, 33 y.o., female Today's Date: 07/13/2024  END OF SESSION:   PT End of Session - 07/13/24 0825     Visit Number 3    Number of Visits 7    Date for Recertification  07/29/24    Authorization Type [employer]    Authorization Time Period 7 visits approved 10/6-4/4    Authorization - Visit Number 2    Authorization - Number of Visits 7    Progress Note Due on Visit 7    PT Start Time 0816    PT Stop Time 0856    PT Time Calculation (min) 40 min    Activity Tolerance Patient tolerated treatment well;Patient limited by pain    Behavior During Therapy [facility] for tasks assessed/performed            Past Medical History:  Diagnosis Date   ADHD (attention deficit hyperactivity disorder)    Anxiety    Asthma    Bipolar 1 disorder (HCC)    Borderline diabetes    DDD (degenerative disc disease), lumbar    Depression    Diabetes mellitus without complication (HCC)    Fibromyalgia    Past Surgical History:  Procedure Laterality Date   CHOLECYSTECTOMY     Patient Active Problem List   Diagnosis Date Noted   Uncontrolled type 2 diabetes mellitus with hyperglycemia (HCC) 01/18/2018   Hypothyroidism 01/18/2018   Class 2 severe obesity due to excess calories with serious comorbidity and body mass index (BMI) of 35.0 to 35.9 in adult 01/18/2018   Current smoker 01/18/2018   ILEUS 02/20/2010   NAUSEA AND VOMITING 02/15/2010   ABDOMINAL PAIN, UPPER 02/15/2010   TRANSAMINASES, SERUM, ELEVATED 02/15/2010    PCP: Colette Torrence GRADE, MD  REFERRING PROVIDER: Trudy Vaughn FALCON, MD  REFERRING DIAG: M54.30 (ICD-10-CM) - Sciatica   Rationale for Evaluation and Treatment: Rehabilitation  THERAPY DIAG:  Pain in right hip  Sciatica of right side  Other low back pain  ONSET DATE: 3 yrs ago when she had her first kid  SUBJECTIVE:                                                                                                                                                                                            SUBJECTIVE STATEMENT: 10/29: pt returns today with significant other.  5/10 pain today, not any better.  States the muscle energy techniques did not help much.  Doing better with HEP compliance.  States her puppy has been keeping her active.  Still walking  the [employer] trial 1/4-1/2 mile.  Evaluation:  Started when I had my kid 3 years ago. Has been hell since with her back. She can't have her kid at home without someone else there because of her pain. Once she had her hip pop back in the socket and felt no pain for a little bit which was the first time in a long time. But then it goes out and has been out still. Knees will go out on her when her hip is in socket which is why they prescribed me the wheelchair when I'm too weak to move. Not able to drive without heat and massage on her back. R leg will shake with long periods of time.  PERTINENT HISTORY:  POTS; bipolar 1; ADHD; anxiety; DM  PAIN:  Are you having pain? Yes: NPRS scale: 7/10 Pain location: R hip down to the lateral side past her knee (not all the time) Pain description: stabbing in her socket with searing hot coals with her back; R hip  Aggravating factors: walking (, sitting  Relieving factors: R hip flexed and L LE extended; W sitting  PRECAUTIONS: Other: POTS (passes out)  RED FLAGS: None   WEIGHT BEARING RESTRICTIONS: No  FALLS:  Has patient fallen in last 6 months? No  LIVING ENVIRONMENT: Lives with: lives with their spouse Lives in: Other with ramp Stairs: No Has following equipment at home: Wheelchair (manual)  OCCUPATION: permanently disabled since she was 2 yrs    PLOF: Independent  PATIENT GOALS: Be able to bend and walk around and take care of her kid  NEXT MD VISIT: TBA  OBJECTIVE:  Note: Objective measures were completed at  Evaluation unless otherwise noted.  DIAGNOSTIC FINDINGS: *None of recent past couple years*  03/13/2022 - MRI Lumbar spine w/o contract IMPRESSION: 1. Marrow edema within the pedicles on the right at L4 and L5, which may be degenerative and related to facet arthrosis, or may reflect stress reaction. 2. Marrow edema also present in the L5 pars interarticularis bilaterally. This may be degenerative and related to facet arthrosis, or may reflect stress reaction/developing pars interarticularis defects. 3. At L4-L5, there is mild disc degeneration. Small disc bulge. Moderate facet arthrosis with ligamentum flavum hypertrophy (greater on the left). Mild relative left subarticular narrowing (without nerve root impingement). Mild left neural foraminal narrowing. 4. At L5-S1, there is mild disc degeneration. Small disc bulge. Mild facet arthrosis and ligamentum flavum hypertrophy. No significant spinal canal or foraminal stenosis. 5. Minimal facet arthrosis at L3-L4.  PATIENT SURVEYS:  Modified Oswestry Low Back Pain Disability Questionnaire: 43 / 50 = 86.0 %  COGNITION: Overall cognitive status: Within functional limits for tasks assessed     SENSATION: Reports deficits with R LE numbness/difficulty with   MUSCLE LENGTH: HS bilaterally tightness - R tightness more than L  POSTURE: rounded shoulders, forward head, and consistent fwd/bwd weight shifting noted  PALPATION: TTP max along R SIJ; piriformis TTP (R); guarding in B glut/hip rotators  Thoracolumbar paraspinals hypertrophy and TTP R ASIS > L ASIS  LUMBAR ROM:   AROM eval  Flexion 40% (*)  Extension 5% (*)   Right lateral flexion 10% (*)  Left lateral flexion 30% (*)  Right rotation 60%  Left rotation 60%   (Blank rows = not tested)  LOWER EXTREMITY MMT:     Active  LEFT eval RIGHT eval  Hip flexion 4 4- painful  Hip extension 3+ 3+ painful  Hip abduction 4- 4- painful  Hip  adduction    Hip internal rotation     Hip external rotation    Knee flexion 4 4- painful  Knee extension 4 4-  Ankle dorsiflexion 4 4-  Ankle plantarflexion 4- 4-  Ankle inversion    Ankle eversion     (Blank rows = not tested)   LUMBAR SPECIAL TESTS: POSTIVE ON R side Straight leg raise test: Negative, Slump test: Positive, SI Compression/distraction test: Positive, and FABER test: Positive   FUNCTIONAL TESTS:  5 times sit to stand: 29s  2 minute walk test: NT - TBA  10/8:  220 feet no AD   30s Sit to stand: 5 reps - increased pain from 8 to 9/10 after 30s therefore deferred further testing GAIT: Distance walked: 46' w/ antalgic lateral gait and decreased rotation throughout  Assistive device utilized: None Level of assistance: SBA and sign other next to her at all times Comments: reports she has to use the w/c sometimes secondary to fatigue and buckling in R LE > L LE  TREATMENT DATE:  07/13/24 SI check is normal Supine: bridge 10X  Single leg bridge Rt only 10X, Lt only 10X  SLR 10X each side  Fig 4 Stretch 3X20  LTR 10X  Hamstring stretch with strap 3X20  Sciatic nerve glide 10X each LE Standing: RTB rows 10X  RTB shoulder extension 10X  06/22/24 220 feet no AD, holding back part of time due to pain SI joint check, MET for Rt anterior rotation Supine:  Rt single leg bridge 10X  SLR 10X each side  Lower trunk rotations 10X each side  Fig 4 stretch 3X20  Piriformis pull across stretch 3X20  hamstring stretch with strap 3X20 each  Sciatic nerve glide 10X each LE  Goal review   06/17/24 Eval SIJ MET for improving alignment - pt reports improved tolerance to standing upon standing post performance of add and extension MET performance  Sciatic n glides with cues for end range decreased numbness   PATIENT EDUCATION:  Education details: Plan of care; improtance of continued progressions and mobility within tolerance  Person educated: Patient and significant other Education method:  Medical Illustrator Education comprehension: verbalized understanding and needs further education  HOME EXERCISE PROGRAM: Access Code: [facility] URL: [email or web address] Date: 06/17/2024 Prepared by: Lamarr Citrin  Exercises - Pelvic Muscle Energy Technique With Flexion and Extension  - 1 x daily - 7 x weekly - 1 sets - 8 reps - 5s hold - Pelvic Muscle Energy Technique With Adduction and Abduction  - 1 x daily - 7 x weekly - 1 sets - 8 reps - 5s hold - Supine Figure 4 Piriformis Stretch  - 1 x daily - 7 x weekly - 6 reps - 10s hold - Supine Sciatic Nerve Glide  - 3 x daily - 7 x weekly - 1 sets - 15 reps  ASSESSMENT:  CLINICAL IMPRESSION: Continued with established POC.  SI joint in alignment today without need for muscle energy technique. Pt with cues to recruit core mm with exercises.  Began standing postural strengthen with cues for keeping upright posturing and maintaining forward gaze.  Cues also required to relax levator mm with activity.   PT did not verbally c/o pain or stop activity due to pain, however did express moans during activities.  Pt did remain jovial throughout session today despite high pain rating.   Continued with focus on LE core recruitment and LE strengthening.    Pt will continue to benefit from skilled  therapy to address functional deficits in activity, transitions and to improve pain tolerance for improved quality of life.   OBJECTIVE IMPAIRMENTS: Abnormal gait, decreased activity tolerance, decreased balance, decreased endurance, decreased mobility, difficulty walking, decreased ROM, decreased strength, increased fascial restrictions, impaired flexibility, postural dysfunction, and pain.   ACTIVITY LIMITATIONS: carrying, lifting, sitting, standing, squatting, sleeping, stairs, and bed mobility  PARTICIPATION LIMITATIONS: driving, shopping, and community activity  PERSONAL FACTORS: Time since onset of injury/illness/exacerbation and 1-2  comorbidities: anxiety, T2DM are also affecting patient's functional outcome.   REHAB POTENTIAL: Fair multiple co morbidities and failed conservative treatment prior  CLINICAL DECISION MAKING: Evolving/moderate complexity  EVALUATION COMPLEXITY: Moderate   GOALS: Goals reviewed with patient? Yes  SHORT TERM GOALS: Target date: 07/08/24  Pt will be independent with HEP in order to demonstrate participation in Physical Therapy POC.  Baseline: Goal status: IN PROGRESS  2.  Pt will report 5/10 pain with mobility in order to demonstrate improved pain with ADLs.  Baseline:  Goal status: IN PROGRESS  LONG TERM GOALS: Target date: 09/03/24  Pt will improve 30s sit to stand by 5 repetitions maintaining <5/10 pain in order to demonstrate improved functional strength to return to desired activities.  Baseline: see objective.  Goal status: IN PROGRESS  2.  Pt will improve 2 MWT by 100' with <5/10 pain in order to demonstrate improved functional ambulatory capacity in community setting.  Baseline: see objective.  Goal status: IN PROGRESS  3.  Pt will improve Modified Oswestry score by 6 points in order to demonstrate improved pain with functional goals and outcomes. Baseline: see objective.  Goal status: IN PROGRESS  4.  Pt will report <4/10 pain with mobility in order to demonstrate reduced pain with ADLs lasting greater than 30 minutes.  Baseline: see objective.  Goal status: IN PROGRESS   5.  Pt will report independence with caretaking of 58yr old son for at least 6 hours at a time in order to return to ADL independence and improved quality of life.  Baseline:  Goal status: IN PROGRESS  PLAN:  PT FREQUENCY: 2x/week  PT DURATION: 6 weeks  PLANNED INTERVENTIONS: 97110-Therapeutic exercises, 97530- Therapeutic activity, [id number]- Neuromuscular re-education, 97535- Self Care, [id number]- Manual therapy, [phone]- Gait training, Patient/Family education, Balance training, and Stair  training.  PLAN FOR NEXT SESSION: reassess alignment of SIJ each visit.  Progress to balance activities.   Greig KATHEE Fuse, PTA/CLT [facility] Ph: [phone]   Fuse Greig KATHEE, PTA 07/13/2024, 8:26 AM

## 2024-07-14 ENCOUNTER — Emergency Department (HOSPITAL_COMMUNITY)
Admission: EM | Admit: 2024-07-14 | Discharge: 2024-07-14 | Disposition: A | Discharge status: Home / Self Care | Payer: MEDICAID | Attending: Emergency Medicine | Admitting: Emergency Medicine

## 2024-07-14 ENCOUNTER — Encounter (HOSPITAL_COMMUNITY): Payer: Self-pay | Admitting: Emergency Medicine

## 2024-07-14 ENCOUNTER — Other Ambulatory Visit: Payer: Self-pay

## 2024-07-14 DIAGNOSIS — Z7984 Long term (current) use of oral hypoglycemic drugs: Secondary | ICD-10-CM | POA: Insufficient documentation

## 2024-07-14 DIAGNOSIS — Z79899 Other long term (current) drug therapy: Secondary | ICD-10-CM | POA: Diagnosis not present

## 2024-07-14 DIAGNOSIS — G8929 Other chronic pain: Secondary | ICD-10-CM | POA: Insufficient documentation

## 2024-07-14 DIAGNOSIS — Z91010 Allergy to peanuts: Secondary | ICD-10-CM | POA: Insufficient documentation

## 2024-07-14 DIAGNOSIS — M5431 Sciatica, right side: Secondary | ICD-10-CM

## 2024-07-14 DIAGNOSIS — E119 Type 2 diabetes mellitus without complications: Secondary | ICD-10-CM | POA: Diagnosis not present

## 2024-07-14 DIAGNOSIS — M25551 Pain in right hip: Secondary | ICD-10-CM | POA: Insufficient documentation

## 2024-07-14 DIAGNOSIS — Z91040 Latex allergy status: Secondary | ICD-10-CM | POA: Insufficient documentation

## 2024-07-14 DIAGNOSIS — E039 Hypothyroidism, unspecified: Secondary | ICD-10-CM | POA: Diagnosis not present

## 2024-07-14 DIAGNOSIS — Z794 Long term (current) use of insulin: Secondary | ICD-10-CM | POA: Insufficient documentation

## 2024-07-14 DIAGNOSIS — M545 Low back pain, unspecified: Secondary | ICD-10-CM | POA: Insufficient documentation

## 2024-07-14 MED ORDER — OXYCODONE-ACETAMINOPHEN 5-325 MG PO TABS: 1.0000 | ORAL_TABLET | Freq: Four times a day (QID) | ORAL | 0 refills | Status: DC | PRN

## 2024-07-14 MED ORDER — KETOROLAC TROMETHAMINE 60 MG/2ML IM SOLN
60.0000 mg | Freq: Once | INTRAMUSCULAR | Status: AC
Start: 2024-07-14 — End: 2024-07-14
  Administered 2024-07-14: 60 mg via INTRAMUSCULAR
  Filled 2024-07-14: qty 2

## 2024-07-14 MED ORDER — OXYCODONE-ACETAMINOPHEN 5-325 MG PO TABS
2.0000 | ORAL_TABLET | Freq: Once | ORAL | Status: AC
  Administered 2024-07-14: 2 via ORAL
  Filled 2024-07-14: qty 2

## 2024-07-14 NOTE — Discharge Instructions (Signed)
 Take ibuprofen  600 mg every 6 hours as needed for pain.  Begin taking Percocet as prescribed as needed for pain not relieved with ibuprofen .  Follow-up with your primary doctor to discuss your chronic pain.

## 2024-07-14 NOTE — ED Provider Notes (Signed)
  EMERGENCY DEPARTMENT AT [facility] Provider Note   CSN: [id number] Arrival date & time: 07/14/24  2154     Patient presents with: Sciatica   Amber Henry is a 33 y.o. female.   Patient is a 33 year old female with history of chronic back pain, diabetes, hypothyroidism, bipolar disorder, fibromyalgia.  Patient presenting today with complaints of pain in her right low back and right hip.  She tells me that this has been a chronic problem and has been ongoing for the past 3 years.  She states that she has seen surgeons, her primary doctor, pain management, but nothing seems to help.  Pain flared up much worse here over the past several days, so presents here.  She denies any new injury or trauma.  No bowel or bladder complaints.       Prior to Admission medications   Medication Sig Start Date End Date Taking? Authorizing Provider  insulin aspart  (NOVOLOG  FLEXPEN) 100 UNIT/ML FlexPen Inject 15-21 Units into the skin 3 (three) times daily before meals. 01/23/20   Nida, Gebreselassie W, MD  insulin detemir  (LEVEMIR  FLEXTOUCH) 100 UNIT/ML FlexPen Inject 50 Units into the skin daily at 10 pm. 01/20/20   Nida, Gebreselassie W, MD  lamoTRIgine (LAMICTAL) 25 MG tablet 1 tablet 2 (two) times daily. 01/11/20   [provider]  levothyroxine  (SYNTHROID ) 100 MCG tablet Take 1 tablet (100 mcg total) by mouth daily before breakfast. 01/24/20 10/20/20  Nida, Gebreselassie W, MD  metFORMIN  (GLUCOPHAGE ) 1000 MG tablet Take 1,000 mg by mouth 2 (two) times daily. 12/09/21   [provider]    Allergies: Peanut butter flavoring agent (non-screening), Sulfamethoxazole-trimethoprim, Amoxicillin, Bactrim, Lithium, Other, Penicillins, Ceclor [cefaclor], and Latex    Review of Systems  All other systems reviewed and are negative.   Updated Vital Signs BP 115/87 (BP Location: Right Arm)   Pulse 93   Temp 98 F (36.7 C) (Oral)   Resp 16   Ht 5' (1.524 m)   Wt 72.6 kg    SpO2 100%   BMI 31.26 kg/m   Physical Exam Vitals and nursing note reviewed.  Constitutional:      Appearance: Normal appearance.  HENT:     Head: Normocephalic.  Pulmonary:     Effort: Pulmonary effort is normal.  Musculoskeletal:     Comments: There is tenderness to palpation in the soft tissues of the right lower lumbar region and right buttock.  Skin:    General: Skin is warm and dry.  Neurological:     Mental Status: She is alert and oriented to person, place, and time.     Comments: DTRs are trace and symmetrical in the patellar and Achilles tendons bilaterally.  Strength is 5 out of 5.     (all labs ordered are listed, but only abnormal results are displayed) Labs Reviewed - No data to display  EKG: None  Radiology: No results found.   Procedures   Medications Ordered in the ED  ketorolac  (TORADOL ) injection 60 mg (has no administration in time range)  oxyCODONE -acetaminophen  (PERCOCET/ROXICET) 5-325 MG per tablet 2 tablet (has no administration in time range)                                    Medical Decision Making Risk Prescription drug management.   Patient presenting with an exacerbation of chronic low back pain.  There are no red flags  in her history or exam that would suggest an emergent situation.  I will give a dose of IM Toradol  along with Percocet and discharged the patient with a small quantity of pain medication.  She is to follow-up with her primary doctor to discuss the next course of action.     Final diagnoses:  None    ED Discharge Orders     None          Geroldine Berg, MD 07/14/24 2319

## 2024-07-14 NOTE — ED Triage Notes (Signed)
 Pt c/o right lower back pain that radiates down her right leg. Hx of sciatica and MS.

## 2024-07-15 ENCOUNTER — Encounter (HOSPITAL_COMMUNITY): Payer: Self-pay

## 2024-07-15 ENCOUNTER — Ambulatory Visit (HOSPITAL_COMMUNITY): Payer: MEDICAID

## 2024-07-15 DIAGNOSIS — M25551 Pain in right hip: Secondary | ICD-10-CM | POA: Diagnosis not present

## 2024-07-15 DIAGNOSIS — M5459 Other low back pain: Secondary | ICD-10-CM

## 2024-07-15 DIAGNOSIS — M5431 Sciatica, right side: Secondary | ICD-10-CM

## 2024-07-15 NOTE — Therapy (Signed)
 OUTPATIENT PHYSICAL THERAPY THORACOLUMBAR TREATMENT   Patient Name: Amber Henry MRN: [id number] DOB:10-01-1990, 33 y.o., female Today's Date: 07/15/2024  END OF SESSION:   PT End of Session - 07/15/24 1049     Visit Number 4    Number of Visits 7    Date for Recertification  07/29/24    Authorization Type [employer]    Authorization Time Period 7 visits approved 10/6-4/4    Authorization - Visit Number 3    Authorization - Number of Visits 7    Progress Note Due on Visit 7    PT Start Time 0818    PT Stop Time 0858    PT Time Calculation (min) 40 min    Activity Tolerance Patient limited by pain;Patient tolerated treatment well    Behavior During Therapy WFL for tasks assessed/performed             Past Medical History:  Diagnosis Date   ADHD (attention deficit hyperactivity disorder)    Anxiety    Asthma    Bipolar 1 disorder (HCC)    Borderline diabetes    DDD (degenerative disc disease), lumbar    Depression    Diabetes mellitus without complication (HCC)    Fibromyalgia    Past Surgical History:  Procedure Laterality Date   CHOLECYSTECTOMY     Patient Active Problem List   Diagnosis Date Noted   Uncontrolled type 2 diabetes mellitus with hyperglycemia (HCC) 01/18/2018   Hypothyroidism 01/18/2018   Class 2 severe obesity due to excess calories with serious comorbidity and body mass index (BMI) of 35.0 to 35.9 in adult 01/18/2018   Current smoker 01/18/2018   ILEUS 02/20/2010   NAUSEA AND VOMITING 02/15/2010   ABDOMINAL PAIN, UPPER 02/15/2010   TRANSAMINASES, SERUM, ELEVATED 02/15/2010    PCP: Colette Torrence GRADE, MD  REFERRING PROVIDER: Trudy Vaughn FALCON, MD  REFERRING DIAG: M54.30 (ICD-10-CM) - Sciatica   Rationale for Evaluation and Treatment: Rehabilitation  THERAPY DIAG:  Pain in right hip  Sciatica of right side  Other low back pain  ONSET DATE: 3 yrs ago when she had her first kid  SUBJECTIVE:                                                                                                                                                                                            SUBJECTIVE STATEMENT: 07/15/24:  Went to ER last night due to increased LB and Rt LE with intermittent tingling and numbness.  Given a tortal shot and Tylenol , advised to seek pain specialist through PCP.  Current pain scale 8/10.  Got 30  minutes sleep last night due to pain.  Evaluation:  Started when I had my kid 3 years ago. Has been hell since with her back. She can't have her kid at home without someone else there because of her pain. Once she had her hip pop back in the socket and felt no pain for a little bit which was the first time in a long time. But then it goes out and has been out still. Knees will go out on her when her hip is in socket which is why they prescribed me the wheelchair when I'm too weak to move. Not able to drive without heat and massage on her back. R leg will shake with long periods of time.  PERTINENT HISTORY:  POTS; bipolar 1; ADHD; anxiety; DM  PAIN:  Are you having pain? Yes: NPRS scale: 8/10 Pain location: R hip down to the lateral side past her knee (not all the time) Pain description: stabbing in her socket with searing hot coals with her back; R hip  Aggravating factors: walking (, sitting  Relieving factors: R hip flexed and L LE extended; W sitting  PRECAUTIONS: Other: POTS (passes out)  RED FLAGS: None   WEIGHT BEARING RESTRICTIONS: No  FALLS:  Has patient fallen in last 6 months? No  LIVING ENVIRONMENT: Lives with: lives with their spouse Lives in: Other with ramp Stairs: No Has following equipment at home: Wheelchair (manual)  OCCUPATION: permanently disabled since she was 2 yrs    PLOF: Independent  PATIENT GOALS: Be able to bend and walk around and take care of her kid  NEXT MD VISIT: TBA  OBJECTIVE:  Note: Objective measures were completed at Evaluation unless  otherwise noted.  DIAGNOSTIC FINDINGS: *None of recent past couple years*  03/13/2022 - MRI Lumbar spine w/o contract IMPRESSION: 1. Marrow edema within the pedicles on the right at L4 and L5, which may be degenerative and related to facet arthrosis, or may reflect stress reaction. 2. Marrow edema also present in the L5 pars interarticularis bilaterally. This may be degenerative and related to facet arthrosis, or may reflect stress reaction/developing pars interarticularis defects. 3. At L4-L5, there is mild disc degeneration. Small disc bulge. Moderate facet arthrosis with ligamentum flavum hypertrophy (greater on the left). Mild relative left subarticular narrowing (without nerve root impingement). Mild left neural foraminal narrowing. 4. At L5-S1, there is mild disc degeneration. Small disc bulge. Mild facet arthrosis and ligamentum flavum hypertrophy. No significant spinal canal or foraminal stenosis. 5. Minimal facet arthrosis at L3-L4.  PATIENT SURVEYS:  Modified Oswestry Low Back Pain Disability Questionnaire: 43 / 50 = 86.0 %  COGNITION: Overall cognitive status: Within functional limits for tasks assessed     SENSATION: Reports deficits with R LE numbness/difficulty with   MUSCLE LENGTH: HS bilaterally tightness - R tightness more than L  POSTURE: rounded shoulders, forward head, and consistent fwd/bwd weight shifting noted  PALPATION: TTP max along R SIJ; piriformis TTP (R); guarding in B glut/hip rotators  Thoracolumbar paraspinals hypertrophy and TTP R ASIS > L ASIS  LUMBAR ROM:   AROM eval  Flexion 40% (*)  Extension 5% (*)   Right lateral flexion 10% (*)  Left lateral flexion 30% (*)  Right rotation 60%  Left rotation 60%   (Blank rows = not tested)  LOWER EXTREMITY MMT:     Active  LEFT eval RIGHT eval  Hip flexion 4 4- painful  Hip extension 3+ 3+ painful  Hip abduction 4- 4- painful  Hip adduction    Hip internal rotation    Hip external  rotation    Knee flexion 4 4- painful  Knee extension 4 4-  Ankle dorsiflexion 4 4-  Ankle plantarflexion 4- 4-  Ankle inversion    Ankle eversion     (Blank rows = not tested)   LUMBAR SPECIAL TESTS: POSTIVE ON R side Straight leg raise test: Negative, Slump test: Positive, SI Compression/distraction test: Positive, and FABER test: Positive   FUNCTIONAL TESTS:  5 times sit to stand: 29s  2 minute walk test: NT - TBA  10/8:  220 feet no AD   30s Sit to stand: 5 reps - increased pain from 8 to 9/10 after 30s therefore deferred further testing GAIT: Distance walked: 79' w/ antalgic lateral gait and decreased rotation throughout  Assistive device utilized: None Level of assistance: SBA and sign other next to her at all times Comments: reports she has to use the w/c sometimes secondary to fatigue and buckling in R LE > L LE  TREATMENT DATE:  07/15/24: SI assessment with Rt anterior hip rotation, Rt LE longer Attempted MET, pt limited by pain and minimal gluteal activation MHP on Rt hip/LBP Lt sidelying positoin TrA activation paired with cueing for breathing 10x 5 Glut sets 10x 5 MET for Rt anterior rotation then outflare Pubic clearance 10x 5  with abdominal sets (250ft)  07/13/24 SI check is normal Supine: bridge 10X  Single leg bridge Rt only 10X, Lt only 10X  SLR 10X each side  Fig 4 Stretch 3X20  LTR 10X  Hamstring stretch with strap 3X20  Sciatic nerve glide 10X each LE Standing: RTB rows 10X  RTB shoulder extension 10X  06/22/24 220 feet no AD, holding back part of time due to pain SI joint check, MET for Rt anterior rotation Supine:  Rt single leg bridge 10X  SLR 10X each side  Lower trunk rotations 10X each side  Fig 4 stretch 3X20  Piriformis pull across stretch 3X20  hamstring stretch with strap 3X20 each  Sciatic nerve glide 10X each LE  Goal review   06/17/24 Eval SIJ MET for improving alignment - pt reports improved tolerance to  standing upon standing post performance of add and extension MET performance  Sciatic n glides with cues for end range decreased numbness   PATIENT EDUCATION:  Education details: Plan of care; improtance of continued progressions and mobility within tolerance  Person educated: Patient and significant other Education method: Medical Illustrator Education comprehension: verbalized understanding and needs further education  HOME EXERCISE PROGRAM: Access Code: [facility] URL: [email or web address] Date: 06/17/2024 Prepared by: Lamarr Citrin  Exercises - Pelvic Muscle Energy Technique With Flexion and Extension  - 1 x daily - 7 x weekly - 1 sets - 8 reps - 5s hold - Pelvic Muscle Energy Technique With Adduction and Abduction  - 1 x daily - 7 x weekly - 1 sets - 8 reps - 5s hold - Supine Figure 4 Piriformis Stretch  - 1 x daily - 7 x weekly - 6 reps - 10s hold - Supine Sciatic Nerve Glide  - 3 x daily - 7 x weekly - 1 sets - 15 reps  ASSESSMENT:  CLINICAL IMPRESSION: Pt arrived with significant pain.  Checked SI alignment with Rt SI anterior rotation and outflare with Rt LE longer.  Attempted MET with minimal gluteal effort.  Pt given MHP and isometric gluteal and core strengtheing exercises with improve effort with MET following.  Equal leg length following MET and presents with increased ease with bridge.  Reports of pain reduced to 6/10.  PT educated on importance of abdominal support to assist with alignment, verbal and tactile cueing to improve activation.  OBJECTIVE IMPAIRMENTS: Abnormal gait, decreased activity tolerance, decreased balance, decreased endurance, decreased mobility, difficulty walking, decreased ROM, decreased strength, increased fascial restrictions, impaired flexibility, postural dysfunction, and pain.   ACTIVITY LIMITATIONS: carrying, lifting, sitting, standing, squatting, sleeping, stairs, and bed mobility  PARTICIPATION LIMITATIONS: driving,  shopping, and community activity  PERSONAL FACTORS: Time since onset of injury/illness/exacerbation and 1-2 comorbidities: anxiety, T2DM are also affecting patient's functional outcome.   REHAB POTENTIAL: Fair multiple co morbidities and failed conservative treatment prior  CLINICAL DECISION MAKING: Evolving/moderate complexity  EVALUATION COMPLEXITY: Moderate   GOALS: Goals reviewed with patient? Yes  SHORT TERM GOALS: Target date: 07/08/24  Pt will be independent with HEP in order to demonstrate participation in Physical Therapy POC.  Baseline: Goal status: IN PROGRESS  2.  Pt will report 5/10 pain with mobility in order to demonstrate improved pain with ADLs.  Baseline:  Goal status: IN PROGRESS  LONG TERM GOALS: Target date: 09/03/24  Pt will improve 30s sit to stand by 5 repetitions maintaining <5/10 pain in order to demonstrate improved functional strength to return to desired activities.  Baseline: see objective.  Goal status: IN PROGRESS  2.  Pt will improve 2 MWT by 100' with <5/10 pain in order to demonstrate improved functional ambulatory capacity in community setting.  Baseline: see objective.  Goal status: IN PROGRESS  3.  Pt will improve Modified Oswestry score by 6 points in order to demonstrate improved pain with functional goals and outcomes. Baseline: see objective.  Goal status: IN PROGRESS  4.  Pt will report <4/10 pain with mobility in order to demonstrate reduced pain with ADLs lasting greater than 30 minutes.  Baseline: see objective.  Goal status: IN PROGRESS   5.  Pt will report independence with caretaking of 65yr old son for at least 6 hours at a time in order to return to ADL independence and improved quality of life.  Baseline:  Goal status: IN PROGRESS  PLAN:  PT FREQUENCY: 2x/week  PT DURATION: 6 weeks  PLANNED INTERVENTIONS: 97110-Therapeutic exercises, 97530- Therapeutic activity, [id number]- Neuromuscular re-education, 97535- Self Care,  [id number]- Manual therapy, [phone]- Gait training, Patient/Family education, Balance training, and Stair training.  PLAN FOR NEXT SESSION: reassess alignment of SIJ each visit.  Progress to balance activities.   Augustin Mclean, LPTA/CLT; CBIS [phone]  Mclean Augustin Amble, PTA 07/15/2024, 11:34 AM

## 2024-07-19 ENCOUNTER — Ambulatory Visit (HOSPITAL_COMMUNITY): Payer: MEDICAID | Attending: Internal Medicine | Admitting: Physical Therapy

## 2024-07-19 DIAGNOSIS — M25551 Pain in right hip: Secondary | ICD-10-CM | POA: Insufficient documentation

## 2024-07-19 DIAGNOSIS — M5459 Other low back pain: Secondary | ICD-10-CM | POA: Insufficient documentation

## 2024-07-19 DIAGNOSIS — M5431 Sciatica, right side: Secondary | ICD-10-CM | POA: Diagnosis present

## 2024-07-19 NOTE — Therapy (Signed)
 OUTPATIENT PHYSICAL THERAPY THORACOLUMBAR TREATMENT   Patient Name: Amber Henry MRN: [id number] DOB:1991-01-02, 33 y.o., female Today's Date: 07/19/2024  END OF SESSION:   PT End of Session - 07/19/24 0819     Visit Number 5    Number of Visits 7    Date for Recertification  07/29/24    Authorization Type [employer]    Authorization Time Period 7 visits approved 10/6-4/4    Authorization - Visit Number 5    Authorization - Number of Visits 7    Progress Note Due on Visit 7    PT Start Time 0818    PT Stop Time 0858    PT Time Calculation (min) 40 min    Activity Tolerance Patient limited by pain;Patient tolerated treatment well    Behavior During Therapy WFL for tasks assessed/performed             Past Medical History:  Diagnosis Date   ADHD (attention deficit hyperactivity disorder)    Anxiety    Asthma    Bipolar 1 disorder (HCC)    Borderline diabetes    DDD (degenerative disc disease), lumbar    Depression    Diabetes mellitus without complication (HCC)    Fibromyalgia    Past Surgical History:  Procedure Laterality Date   CHOLECYSTECTOMY     Patient Active Problem List   Diagnosis Date Noted   Uncontrolled type 2 diabetes mellitus with hyperglycemia (HCC) 01/18/2018   Hypothyroidism 01/18/2018   Class 2 severe obesity due to excess calories with serious comorbidity and body mass index (BMI) of 35.0 to 35.9 in adult 01/18/2018   Current smoker 01/18/2018   ILEUS 02/20/2010   NAUSEA AND VOMITING 02/15/2010   ABDOMINAL PAIN, UPPER 02/15/2010   TRANSAMINASES, SERUM, ELEVATED 02/15/2010    PCP: Colette Torrence GRADE, MD  REFERRING PROVIDER: Trudy Vaughn FALCON, MD  REFERRING DIAG: M54.30 (ICD-10-CM) - Sciatica   Rationale for Evaluation and Treatment: Rehabilitation  THERAPY DIAG:  Pain in right hip  Sciatica of right side  Other low back pain  ONSET DATE: 3 yrs ago when she had her first kid  SUBJECTIVE:                                                                                                                                                                                            SUBJECTIVE STATEMENT: 07/19/24:  pt reports went Wedding dress shopping and elevated pain.  Currently less at 6/10 and feels therapy is helping overall. Reports she has not gotten an appt with pain specialist yet, waiting on PCP referral and to set that  up. States her sleeping is still not full, waking several times due to pain and having to use the bathroom.   Evaluation:  Started when I had my kid 3 years ago. Has been hell since with her back. She can't have her kid at home without someone else there because of her pain. Once she had her hip pop back in the socket and felt no pain for a little bit which was the first time in a long time. But then it goes out and has been out still. Knees will go out on her when her hip is in socket which is why they prescribed me the wheelchair when I'm too weak to move. Not able to drive without heat and massage on her back. R leg will shake with long periods of time.  PERTINENT HISTORY:  POTS; bipolar 1; ADHD; anxiety; DM  PAIN:  Are you having pain? Yes: NPRS scale: 6/10 Pain location: R hip down to the lateral side past her knee (not all the time) Pain description: stabbing in her socket with searing hot coals with her back; R hip  Aggravating factors: walking (, sitting  Relieving factors: R hip flexed and L LE extended; W sitting  PRECAUTIONS: Other: POTS (passes out)  RED FLAGS: None   WEIGHT BEARING RESTRICTIONS: No  FALLS:  Has patient fallen in last 6 months? No  LIVING ENVIRONMENT: Lives with: lives with their spouse Lives in: Other with ramp Stairs: No Has following equipment at home: Wheelchair (manual)  OCCUPATION: permanently disabled since she was 2 yrs    PLOF: Independent  PATIENT GOALS: Be able to bend and walk around and take care of her kid  NEXT MD  VISIT: TBA  OBJECTIVE:  Note: Objective measures were completed at Evaluation unless otherwise noted.  DIAGNOSTIC FINDINGS: *None of recent past couple years*  03/13/2022 - MRI Lumbar spine w/o contract IMPRESSION: 1. Marrow edema within the pedicles on the right at L4 and L5, which may be degenerative and related to facet arthrosis, or may reflect stress reaction. 2. Marrow edema also present in the L5 pars interarticularis bilaterally. This may be degenerative and related to facet arthrosis, or may reflect stress reaction/developing pars interarticularis defects. 3. At L4-L5, there is mild disc degeneration. Small disc bulge. Moderate facet arthrosis with ligamentum flavum hypertrophy (greater on the left). Mild relative left subarticular narrowing (without nerve root impingement). Mild left neural foraminal narrowing. 4. At L5-S1, there is mild disc degeneration. Small disc bulge. Mild facet arthrosis and ligamentum flavum hypertrophy. No significant spinal canal or foraminal stenosis. 5. Minimal facet arthrosis at L3-L4.  PATIENT SURVEYS:  Modified Oswestry Low Back Pain Disability Questionnaire: 43 / 50 = 86.0 %  COGNITION: Overall cognitive status: Within functional limits for tasks assessed     SENSATION: Reports deficits with R LE numbness/difficulty with   MUSCLE LENGTH: HS bilaterally tightness - R tightness more than L  POSTURE: rounded shoulders, forward head, and consistent fwd/bwd weight shifting noted  PALPATION: TTP max along R SIJ; piriformis TTP (R); guarding in B glut/hip rotators  Thoracolumbar paraspinals hypertrophy and TTP R ASIS > L ASIS  LUMBAR ROM:   AROM eval  Flexion 40% (*)  Extension 5% (*)   Right lateral flexion 10% (*)  Left lateral flexion 30% (*)  Right rotation 60%  Left rotation 60%   (Blank rows = not tested)  LOWER EXTREMITY MMT:     Active  LEFT eval RIGHT eval  Hip flexion 4 4-  painful  Hip extension 3+ 3+ painful   Hip abduction 4- 4- painful  Hip adduction    Hip internal rotation    Hip external rotation    Knee flexion 4 4- painful  Knee extension 4 4-  Ankle dorsiflexion 4 4-  Ankle plantarflexion 4- 4-  Ankle inversion    Ankle eversion     (Blank rows = not tested)   LUMBAR SPECIAL TESTS: POSTIVE ON R side Straight leg raise test: Negative, Slump test: Positive, SI Compression/distraction test: Positive, and FABER test: Positive   FUNCTIONAL TESTS:  5 times sit to stand: 29s  2 minute walk test: NT - TBA  10/8:  220 feet no AD   30s Sit to stand: 5 reps - increased pain from 8 to 9/10 after 30s therefore deferred further testing GAIT: Distance walked: 34' w/ antalgic lateral gait and decreased rotation throughout  Assistive device utilized: None Level of assistance: SBA and sign other next to her at all times Comments: reports she has to use the w/c sometimes secondary to fatigue and buckling in R LE > L LE  TREATMENT DATE:  07/19/24 SI check is normal Supine: bridge 10X  Single leg bridge Rt only 10X, Lt only 10X  Bridge with GTB abduction with lift 10X  SLR 10X each side  Fig 4 Stretch 3X20  LTR 10X  Hamstring stretch with UE holds 3X20 Sidelying clams 10X each side  07/15/24: SI assessment with Rt anterior hip rotation, Rt LE longer Attempted MET, pt limited by pain and minimal gluteal activation MHP on Rt hip/LBP Lt sidelying positoin TrA activation paired with cueing for breathing 10x 5 Glut sets 10x 5 MET for Rt anterior rotation then outflare Pubic clearance 10x 5  with abdominal sets (232ft)  07/13/24 SI check is normal Supine: bridge 10X  Single leg bridge Rt only 10X, Lt only 10X  SLR 10X each side  Fig 4 Stretch 3X20  LTR 10X  Hamstring stretch with strap 3X20  Sciatic nerve glide 10X each LE Standing: RTB rows 10X  RTB shoulder extension 10X  06/22/24 220 feet no AD, holding back part of time due to pain SI joint check, MET for Rt  anterior rotation Supine:  Rt single leg bridge 10X  SLR 10X each side  Lower trunk rotations 10X each side  Fig 4 stretch 3X20  Piriformis pull across stretch 3X20  hamstring stretch with strap 3X20 each  Sciatic nerve glide 10X each LE  Goal review   06/17/24 Eval SIJ MET for improving alignment - pt reports improved tolerance to standing upon standing post performance of add and extension MET performance  Sciatic n glides with cues for end range decreased numbness   PATIENT EDUCATION:  Education details: Plan of care; improtance of continued progressions and mobility within tolerance  Person educated: Patient and significant other Education method: Medical Illustrator Education comprehension: verbalized understanding and needs further education  HOME EXERCISE PROGRAM: Access Code: [facility] URL: [email or web address] Date: 06/17/2024 Prepared by: Lamarr Citrin  Exercises - Pelvic Muscle Energy Technique With Flexion and Extension  - 1 x daily - 7 x weekly - 1 sets - 8 reps - 5s hold - Pelvic Muscle Energy Technique With Adduction and Abduction  - 1 x daily - 7 x weekly - 1 sets - 8 reps - 5s hold - Supine Figure 4 Piriformis Stretch  - 1 x daily - 7 x weekly - 6 reps - 10s hold - Supine Sciatic  Nerve Glide  - 3 x daily - 7 x weekly - 1 sets - 15 reps  ASSESSMENT:  CLINICAL IMPRESSION: Pt with reduced pain today upon arrival.  Checked SIJ with good alignment today.  Progressed bridge activity with added theraband/abduction hold using theraband resistance. Sidelying clams completed.  PT with magnifying symptoms with every activity complete today.  Pt with positive pain behaviors/verbalization even in minimal range of motion for activities.  Otherwise, pt did remain jovial during session and reported no increased pain at end of session today.  Normal gait observed at end of session different from antalgic gait at beginning of session. No change in pain  rating.     OBJECTIVE IMPAIRMENTS: Abnormal gait, decreased activity tolerance, decreased balance, decreased endurance, decreased mobility, difficulty walking, decreased ROM, decreased strength, increased fascial restrictions, impaired flexibility, postural dysfunction, and pain.   ACTIVITY LIMITATIONS: carrying, lifting, sitting, standing, squatting, sleeping, stairs, and bed mobility  PARTICIPATION LIMITATIONS: driving, shopping, and community activity  PERSONAL FACTORS: Time since onset of injury/illness/exacerbation and 1-2 comorbidities: anxiety, T2DM are also affecting patient's functional outcome.   REHAB POTENTIAL: Fair multiple co morbidities and failed conservative treatment prior  CLINICAL DECISION MAKING: Evolving/moderate complexity  EVALUATION COMPLEXITY: Moderate   GOALS: Goals reviewed with patient? Yes  SHORT TERM GOALS: Target date: 07/08/24  Pt will be independent with HEP in order to demonstrate participation in Physical Therapy POC.  Baseline: Goal status: IN PROGRESS  2.  Pt will report 5/10 pain with mobility in order to demonstrate improved pain with ADLs.  Baseline:  Goal status: IN PROGRESS  LONG TERM GOALS: Target date: 09/03/24  Pt will improve 30s sit to stand by 5 repetitions maintaining <5/10 pain in order to demonstrate improved functional strength to return to desired activities.  Baseline: see objective.  Goal status: IN PROGRESS  2.  Pt will improve 2 MWT by 100' with <5/10 pain in order to demonstrate improved functional ambulatory capacity in community setting.  Baseline: see objective.  Goal status: IN PROGRESS  3.  Pt will improve Modified Oswestry score by 6 points in order to demonstrate improved pain with functional goals and outcomes. Baseline: see objective.  Goal status: IN PROGRESS  4.  Pt will report <4/10 pain with mobility in order to demonstrate reduced pain with ADLs lasting greater than 30 minutes.  Baseline: see  objective.  Goal status: IN PROGRESS   5.  Pt will report independence with caretaking of 29yr old son for at least 6 hours at a time in order to return to ADL independence and improved quality of life.  Baseline:  Goal status: IN PROGRESS  PLAN:  PT FREQUENCY: 2x/week  PT DURATION: 6 weeks  PLANNED INTERVENTIONS: 97110-Therapeutic exercises, 97530- Therapeutic activity, [id number]- Neuromuscular re-education, 97535- Self Care, [id number]- Manual therapy, [phone]- Gait training, Patient/Family education, Balance training, and Stair training.  PLAN FOR NEXT SESSION: reassess alignment of SIJ each visit.  Reassess in 2 more sessions.    Vivian No B, PTA 07/19/2024, 12:13 PM

## 2024-07-21 ENCOUNTER — Encounter (HOSPITAL_COMMUNITY): Payer: MEDICAID

## 2024-07-26 ENCOUNTER — Ambulatory Visit (HOSPITAL_COMMUNITY): Payer: MEDICAID | Admitting: Physical Therapy

## 2024-07-26 DIAGNOSIS — M25551 Pain in right hip: Secondary | ICD-10-CM

## 2024-07-26 DIAGNOSIS — M5431 Sciatica, right side: Secondary | ICD-10-CM

## 2024-07-26 DIAGNOSIS — M5459 Other low back pain: Secondary | ICD-10-CM

## 2024-07-26 NOTE — Therapy (Signed)
 OUTPATIENT PHYSICAL THERAPY THORACOLUMBAR TREATMENT   Patient Name: Amber Henry MRN: [id number] DOB:[birth year], 33 y.o., female Today's Date: 07/26/2024  END OF SESSION:   PT End of Session - 07/26/24 0901     Visit Number 6    Number of Visits 7    Date for Recertification  07/29/24    Authorization Type [employer]    Authorization Time Period 7 visits approved 10/6-4/4    Authorization - Visit Number 6    Authorization - Number of Visits 7    Progress Note Due on Visit 7    PT Start Time 0818    PT Stop Time 0859    PT Time Calculation (min) 41 min    Activity Tolerance Patient limited by pain;Patient tolerated treatment well    Behavior During Therapy WFL for tasks assessed/performed              Past Medical History:  Diagnosis Date   ADHD (attention deficit hyperactivity disorder)    Anxiety    Asthma    Bipolar 1 disorder (HCC)    Borderline diabetes    DDD (degenerative disc disease), lumbar    Depression    Diabetes mellitus without complication (HCC)    Fibromyalgia    Past Surgical History:  Procedure Laterality Date   CHOLECYSTECTOMY     Patient Active Problem List   Diagnosis Date Noted   Uncontrolled type 2 diabetes mellitus with hyperglycemia (HCC) 01/18/2018   Hypothyroidism 01/18/2018   Class 2 severe obesity due to excess calories with serious comorbidity and body mass index (BMI) of 35.0 to 35.9 in adult 01/18/2018   Current smoker 01/18/2018   ILEUS 02/20/2010   NAUSEA AND VOMITING 02/15/2010   ABDOMINAL PAIN, UPPER 02/15/2010   TRANSAMINASES, SERUM, ELEVATED 02/15/2010    PCP: Colette Torrence GRADE, MD  REFERRING PROVIDER: Trudy Vaughn FALCON, MD  REFERRING DIAG: M54.30 (ICD-10-CM) - Sciatica   Rationale for Evaluation and Treatment: Rehabilitation  THERAPY DIAG:  Pain in right hip  Sciatica of right side  Other low back pain  ONSET DATE: 3 yrs ago when she had her first kid  SUBJECTIVE:                                                                                                                                                                                            SUBJECTIVE STATEMENT: 07/26/24:  Pt reports pain is less today at 4/10.  Reports she actually slept decent last night due to getting new chair and slept in that.    Evaluation:  Started when I had my kid 3 years  ago. Has been hell since with her back. She can't have her kid at home without someone else there because of her pain. Once she had her hip pop back in the socket and felt no pain for a little bit which was the first time in a long time. But then it goes out and has been out still. Knees will go out on her when her hip is in socket which is why they prescribed me the wheelchair when I'm too weak to move. Not able to drive without heat and massage on her back. R leg will shake with long periods of time.  PERTINENT HISTORY:  POTS; bipolar 1; ADHD; anxiety; DM  PAIN:  Are you having pain? Yes: NPRS scale: 4/10 Pain location: R hip down to the lateral side past her knee (not all the time) Pain description: stabbing in her socket with searing hot coals with her back; R hip  Aggravating factors: walking (, sitting  Relieving factors: R hip flexed and L LE extended; W sitting  PRECAUTIONS: Other: POTS (passes out)  RED FLAGS: None   WEIGHT BEARING RESTRICTIONS: No  FALLS:  Has patient fallen in last 6 months? No  LIVING ENVIRONMENT: Lives with: lives with their spouse Lives in: Other with ramp Stairs: No Has following equipment at home: Wheelchair (manual)  OCCUPATION: permanently disabled since she was 2 yrs    PLOF: Independent  PATIENT GOALS: Be able to bend and walk around and take care of her kid  NEXT MD VISIT: TBA  OBJECTIVE:  Note: Objective measures were completed at Evaluation unless otherwise noted.  DIAGNOSTIC FINDINGS: *None of recent past couple years*  03/13/2022 - MRI Lumbar spine w/o  contract IMPRESSION: 1. Marrow edema within the pedicles on the right at L4 and L5, which may be degenerative and related to facet arthrosis, or may reflect stress reaction. 2. Marrow edema also present in the L5 pars interarticularis bilaterally. This may be degenerative and related to facet arthrosis, or may reflect stress reaction/developing pars interarticularis defects. 3. At L4-L5, there is mild disc degeneration. Small disc bulge. Moderate facet arthrosis with ligamentum flavum hypertrophy (greater on the left). Mild relative left subarticular narrowing (without nerve root impingement). Mild left neural foraminal narrowing. 4. At L5-S1, there is mild disc degeneration. Small disc bulge. Mild facet arthrosis and ligamentum flavum hypertrophy. No significant spinal canal or foraminal stenosis. 5. Minimal facet arthrosis at L3-L4.  PATIENT SURVEYS:  Modified Oswestry Low Back Pain Disability Questionnaire: 43 / 50 = 86.0 %  COGNITION: Overall cognitive status: Within functional limits for tasks assessed     SENSATION: Reports deficits with R LE numbness/difficulty with   MUSCLE LENGTH: HS bilaterally tightness - R tightness more than L  POSTURE: rounded shoulders, forward head, and consistent fwd/bwd weight shifting noted  PALPATION: TTP max along R SIJ; piriformis TTP (R); guarding in B glut/hip rotators  Thoracolumbar paraspinals hypertrophy and TTP R ASIS > L ASIS  LUMBAR ROM:   AROM eval  Flexion 40% (*)  Extension 5% (*)   Right lateral flexion 10% (*)  Left lateral flexion 30% (*)  Right rotation 60%  Left rotation 60%   (Blank rows = not tested)  LOWER EXTREMITY MMT:     Active  LEFT eval RIGHT eval  Hip flexion 4 4- painful  Hip extension 3+ 3+ painful  Hip abduction 4- 4- painful  Hip adduction    Hip internal rotation    Hip external rotation    Knee  flexion 4 4- painful  Knee extension 4 4-  Ankle dorsiflexion 4 4-  Ankle plantarflexion 4-  4-  Ankle inversion    Ankle eversion     (Blank rows = not tested)   LUMBAR SPECIAL TESTS: POSTIVE ON R side Straight leg raise test: Negative, Slump test: Positive, SI Compression/distraction test: Positive, and FABER test: Positive   FUNCTIONAL TESTS:  5 times sit to stand: 29s  2 minute walk test: NT - TBA  10/8:  220 feet no AD   30s Sit to stand: 5 reps - increased pain from 8 to 9/10 after 30s therefore deferred further testing GAIT: Distance walked: 63' w/ antalgic lateral gait and decreased rotation throughout  Assistive device utilized: None Level of assistance: SBA and sign other next to her at all times Comments: reports she has to use the w/c sometimes secondary to fatigue and buckling in R LE > L LE  TREATMENT DATE:  07/26/24 Standing:  heelraises 10X  Lumbar extension 10X  Hip abduction 10X each  Hip extension 10X each  Vectors with 1 UE assist 5X5 each LE  RTB rows 2X10  RTB shoulder extension 2X10  Pallof 2X10 each direction Supine: bridge 10X2 with feet on green physioball  Bridge and hamstring curl, roll in with green physioball 4 sets 5 reps  Single leg bridge Rt only 10X, Lt only 10X  Bridge with GTB abduction with lift 10X SLR 10X2 each side   07/19/24 SI check is normal Supine: bridge 10X  Single leg bridge Rt only 10X, Lt only 10X  Bridge with GTB abduction with lift 10X  SLR 10X each side  Fig 4 Stretch 3X20  LTR 10X  Hamstring stretch with UE holds 3X20 Sidelying clams 10X each side  07/15/24: SI assessment with Rt anterior hip rotation, Rt LE longer Attempted MET, pt limited by pain and minimal gluteal activation MHP on Rt hip/LBP Lt sidelying positoin TrA activation paired with cueing for breathing 10x 5 Glut sets 10x 5 MET for Rt anterior rotation then outflare Pubic clearance 10x 5  with abdominal sets (265ft)  07/13/24 SI check is normal Supine: bridge 10X  Single leg bridge Rt only 10X, Lt only 10X  SLR 10X each  side  Fig 4 Stretch 3X20  LTR 10X  Hamstring stretch with strap 3X20  Sciatic nerve glide 10X each LE Standing: RTB rows 10X  RTB shoulder extension 10X  06/22/24 220 feet no AD, holding back part of time due to pain SI joint check, MET for Rt anterior rotation Supine:  Rt single leg bridge 10X  SLR 10X each side  Lower trunk rotations 10X each side  Fig 4 stretch 3X20  Piriformis pull across stretch 3X20  hamstring stretch with strap 3X20 each  Sciatic nerve glide 10X each LE  Goal review   06/17/24 Eval SIJ MET for improving alignment - pt reports improved tolerance to standing upon standing post performance of add and extension MET performance  Sciatic n glides with cues for end range decreased numbness   PATIENT EDUCATION:  Education details: Plan of care; improtance of continued progressions and mobility within tolerance  Person educated: Patient and significant other Education method: Medical Illustrator Education comprehension: verbalized understanding and needs further education  HOME EXERCISE PROGRAM: Access Code: [facility] URL: [email or web address] Date: 06/17/2024 Prepared by: Lamarr Citrin  Exercises - Pelvic Muscle Energy Technique With Flexion and Extension  - 1 x daily - 7 x weekly - 1 sets -  8 reps - 5s hold - Pelvic Muscle Energy Technique With Adduction and Abduction  - 1 x daily - 7 x weekly - 1 sets - 8 reps - 5s hold - Supine Figure 4 Piriformis Stretch  - 1 x daily - 7 x weekly - 6 reps - 10s hold - Supine Sciatic Nerve Glide  - 3 x daily - 7 x weekly - 1 sets - 15 reps  ASSESSMENT:  CLINICAL IMPRESSION: Pt continues to present with reduction in pain as compared to previous sessions.  Progressed session with standing LE and core stab challenges.  Pt able to complete all these without any pain or issues, min cues for stabilzation to maintain core.  Progressed bridge and added hamstring roll outs/core stab using green  physionball.  Able to maintain stability throughout activity.  Pt maintained low/absent pain ratings/behaviors, overall done well today.  Less antalgic gait noted today as compared to previous sessions.  Pt has one visit remaining.     OBJECTIVE IMPAIRMENTS: Abnormal gait, decreased activity tolerance, decreased balance, decreased endurance, decreased mobility, difficulty walking, decreased ROM, decreased strength, increased fascial restrictions, impaired flexibility, postural dysfunction, and pain.   ACTIVITY LIMITATIONS: carrying, lifting, sitting, standing, squatting, sleeping, stairs, and bed mobility  PARTICIPATION LIMITATIONS: driving, shopping, and community activity  PERSONAL FACTORS: Time since onset of injury/illness/exacerbation and 1-2 comorbidities: anxiety, T2DM are also affecting patient's functional outcome.   REHAB POTENTIAL: Fair multiple co morbidities and failed conservative treatment prior  CLINICAL DECISION MAKING: Evolving/moderate complexity  EVALUATION COMPLEXITY: Moderate   GOALS: Goals reviewed with patient? Yes  SHORT TERM GOALS: Target date: 07/08/24  Pt will be independent with HEP in order to demonstrate participation in Physical Therapy POC.  Baseline: Goal status: IN PROGRESS  2.  Pt will report 5/10 pain with mobility in order to demonstrate improved pain with ADLs.  Baseline:  Goal status: IN PROGRESS  LONG TERM GOALS: Target date: 09/03/24  Pt will improve 30s sit to stand by 5 repetitions maintaining <5/10 pain in order to demonstrate improved functional strength to return to desired activities.  Baseline: see objective.  Goal status: IN PROGRESS  2.  Pt will improve 2 MWT by 100' with <5/10 pain in order to demonstrate improved functional ambulatory capacity in community setting.  Baseline: see objective.  Goal status: IN PROGRESS  3.  Pt will improve Modified Oswestry score by 6 points in order to demonstrate improved pain with  functional goals and outcomes. Baseline: see objective.  Goal status: IN PROGRESS  4.  Pt will report <4/10 pain with mobility in order to demonstrate reduced pain with ADLs lasting greater than 30 minutes.  Baseline: see objective.  Goal status: IN PROGRESS   5.  Pt will report independence with caretaking of 62yr old son for at least 6 hours at a time in order to return to ADL independence and improved quality of life.  Baseline:  Goal status: IN PROGRESS  PLAN:  PT FREQUENCY: 2x/week  PT DURATION: 6 weeks  PLANNED INTERVENTIONS: 97110-Therapeutic exercises, 97530- Therapeutic activity, [id number]- Neuromuscular re-education, 97535- Self Care, [id number]- Manual therapy, [phone]- Gait training, Patient/Family education, Balance training, and Stair training.  PLAN FOR NEXT SESSION: reassess alignment of SIJ each visit.  Reassess next session.     Vivian, Trishna Cwik B, PTA 07/26/2024, 9:06 AM

## 2024-07-28 ENCOUNTER — Encounter (HOSPITAL_COMMUNITY): Payer: Self-pay

## 2024-07-28 ENCOUNTER — Ambulatory Visit (HOSPITAL_COMMUNITY): Payer: MEDICAID

## 2024-07-28 DIAGNOSIS — M5459 Other low back pain: Secondary | ICD-10-CM

## 2024-07-28 DIAGNOSIS — M5431 Sciatica, right side: Secondary | ICD-10-CM

## 2024-07-28 DIAGNOSIS — M25551 Pain in right hip: Secondary | ICD-10-CM

## 2024-07-28 NOTE — Therapy (Signed)
 OUTPATIENT PHYSICAL THERAPY THORACOLUMBAR TREATMENT   Patient Name: Amber Henry MRN: [id number] DOB:[birth year], 33 y.o., female Today's Date: 07/28/2024  END OF SESSION:   PT End of Session - 07/28/24 0817     Visit Number 7    Number of Visits 13    Date for Recertification  09/09/24    Authorization Type [employer]    Authorization Time Period 7 visits approved 10/6-4/4    Authorization - Visit Number 7    Authorization - Number of Visits 7    Progress Note Due on Visit 7    PT Start Time 0817    PT Stop Time 0853   limited by pain   PT Time Calculation (min) 36 min    Activity Tolerance Patient limited by pain;Patient limited by fatigue    Behavior During Therapy [facility] for tasks assessed/performed               Past Medical History:  Diagnosis Date   ADHD (attention deficit hyperactivity disorder)    Anxiety    Asthma    Bipolar 1 disorder (HCC)    Borderline diabetes    DDD (degenerative disc disease), lumbar    Depression    Diabetes mellitus without complication (HCC)    Fibromyalgia    Past Surgical History:  Procedure Laterality Date   CHOLECYSTECTOMY     Patient Active Problem List   Diagnosis Date Noted   Uncontrolled type 2 diabetes mellitus with hyperglycemia (HCC) 01/18/2018   Hypothyroidism 01/18/2018   Class 2 severe obesity due to excess calories with serious comorbidity and body mass index (BMI) of 35.0 to 35.9 in adult 01/18/2018   Current smoker 01/18/2018   ILEUS 02/20/2010   NAUSEA AND VOMITING 02/15/2010   ABDOMINAL PAIN, UPPER 02/15/2010   TRANSAMINASES, SERUM, ELEVATED 02/15/2010    PCP: Colette Torrence GRADE, MD  REFERRING PROVIDER: Trudy Vaughn FALCON, MD  REFERRING DIAG: M54.30 (ICD-10-CM) - Sciatica   Rationale for Evaluation and Treatment: Rehabilitation  THERAPY DIAG:  Pain in right hip  Sciatica of right side  Other low back pain  ONSET DATE: 3 yrs ago when she had her first kid  SUBJECTIVE:                                                                                                                                                                                            SUBJECTIVE STATEMENT: Patient feels that she is getting better since starting physical therapy. She has noticed improvements in pain and about to walk longer. She can now even take her dogs out for walks.   Evaluation:  Started when I had my kid 3 years ago. Has been hell since with her back. She can't have her kid at home without someone else there because of her pain. Once she had her hip pop back in the socket and felt no pain for a little bit which was the first time in a long time. But then it goes out and has been out still. Knees will go out on her when her hip is in socket which is why they prescribed me the wheelchair when I'm too weak to move. Not able to drive without heat and massage on her back. R leg will shake with long periods of time.  PERTINENT HISTORY:  POTS; bipolar 1; ADHD; anxiety; DM  PAIN:  Are you having pain? Yes: NPRS scale: 6/10 Pain location: R hip down to the lateral side past her knee (not all the time) Pain description: stabbing in her socket with searing hot coals with her back; R hip  Aggravating factors: walking (, sitting  Relieving factors: R hip flexed and L LE extended; W sitting  PRECAUTIONS: Other: POTS (passes out)  RED FLAGS: None   WEIGHT BEARING RESTRICTIONS: No  FALLS:  Has patient fallen in last 6 months? No  LIVING ENVIRONMENT: Lives with: lives with their spouse Lives in: Other with ramp Stairs: No Has following equipment at home: Wheelchair (manual)  OCCUPATION: permanently disabled since she was 2 yrs    PLOF: Independent  PATIENT GOALS: Be able to bend and walk around and take care of her kid  NEXT MD VISIT: unsure  OBJECTIVE:  Note: Objective measures were completed at Evaluation unless otherwise noted.  DIAGNOSTIC FINDINGS: *None of  recent past couple years*  03/13/2022 - MRI Lumbar spine w/o contract IMPRESSION: 1. Marrow edema within the pedicles on the right at L4 and L5, which may be degenerative and related to facet arthrosis, or may reflect stress reaction. 2. Marrow edema also present in the L5 pars interarticularis bilaterally. This may be degenerative and related to facet arthrosis, or may reflect stress reaction/developing pars interarticularis defects. 3. At L4-L5, there is mild disc degeneration. Small disc bulge. Moderate facet arthrosis with ligamentum flavum hypertrophy (greater on the left). Mild relative left subarticular narrowing (without nerve root impingement). Mild left neural foraminal narrowing. 4. At L5-S1, there is mild disc degeneration. Small disc bulge. Mild facet arthrosis and ligamentum flavum hypertrophy. No significant spinal canal or foraminal stenosis. 5. Minimal facet arthrosis at L3-L4.  PATIENT SURVEYS:  Modified Oswestry Low Back Pain Disability Questionnaire: 43 / 50 = 86.0 %  COGNITION: Overall cognitive status: Within functional limits for tasks assessed     SENSATION: Reports deficits with R LE numbness/difficulty with   MUSCLE LENGTH: HS bilaterally tightness - R tightness more than L  POSTURE: rounded shoulders, forward head, and consistent fwd/bwd weight shifting noted  PALPATION: TTP max along R SIJ; piriformis TTP (R); guarding in B glut/hip rotators  Thoracolumbar paraspinals hypertrophy and TTP R ASIS > L ASIS  LUMBAR ROM:   AROM eval  Flexion 40% (*)  Extension 5% (*)   Right lateral flexion 10% (*)  Left lateral flexion 30% (*)  Right rotation 60%  Left rotation 60%   (Blank rows = not tested)  LOWER EXTREMITY MMT:     Active  LEFT eval RIGHT eval  Hip flexion 4 4- painful  Hip extension 3+ 3+ painful  Hip abduction 4- 4- painful  Hip adduction    Hip internal rotation  Hip external rotation    Knee flexion 4 4- painful  Knee  extension 4 4-  Ankle dorsiflexion 4 4-  Ankle plantarflexion 4- 4-  Ankle inversion    Ankle eversion     (Blank rows = not tested)   LUMBAR SPECIAL TESTS: POSTIVE ON R side Straight leg raise test: Negative, Slump test: Positive, SI Compression/distraction test: Positive, and FABER test: Positive   FUNCTIONAL TESTS:  5 times sit to stand: 29s  2 minute walk test: NT - TBA  10/8:  220 feet no AD   30s Sit to stand: 5 reps - increased pain from 8 to 9/10 after 30s therefore deferred further testing GAIT: Distance walked: 61' w/ antalgic lateral gait and decreased rotation throughout  Assistive device utilized: None Level of assistance: SBA and sign other next to her at all times Comments: reports she has to use the w/c sometimes secondary to fatigue and buckling in R LE > L LE  TREATMENT DATE:                                    07/28/24 EXERCISE LOG  Exercise Repetitions and Resistance Comments  Goal assessment for progress note  See below    Standing donkey kick   15 reps each  BUE support from parallel bars   Squatting   15 reps  BUE support from parallel bars   Seated hip ADD isometric  1 minutes w/ 5 second hold  50% max effort  Seated heel/toe raise   20 reps each    Resisted pull down  RTB x 15 reps    Resisted punch out  RTB x 12 reps each  With lumbar rotation    Blank cell = exercise not performed today   07/26/24 Standing:  heelraises 10X  Lumbar extension 10X  Hip abduction 10X each  Hip extension 10X each  Vectors with 1 UE assist 5X5 each LE  RTB rows 2X10  RTB shoulder extension 2X10  Pallof 2X10 each direction Supine: bridge 10X2 with feet on green physioball  Bridge and hamstring curl, roll in with green physioball 4 sets 5 reps  Single leg bridge Rt only 10X, Lt only 10X  Bridge with GTB abduction with lift 10X SLR 10X2 each side   07/19/24 SI check is normal Supine: bridge 10X  Single leg bridge Rt only 10X, Lt only 10X  Bridge with GTB  abduction with lift 10X  SLR 10X each side  Fig 4 Stretch 3X20  LTR 10X  Hamstring stretch with UE holds 3X20 Sidelying clams 10X each side  07/15/24: SI assessment with Rt anterior hip rotation, Rt LE longer Attempted MET, pt limited by pain and minimal gluteal activation MHP on Rt hip/LBP Lt sidelying positoin TrA activation paired with cueing for breathing 10x 5 Glut sets 10x 5 MET for Rt anterior rotation then outflare Pubic clearance 10x 5  with abdominal sets (233ft)  07/13/24 SI check is normal Supine: bridge 10X  Single leg bridge Rt only 10X, Lt only 10X  SLR 10X each side  Fig 4 Stretch 3X20  LTR 10X  Hamstring stretch with strap 3X20  Sciatic nerve glide 10X each LE Standing: RTB rows 10X  RTB shoulder extension 10X  PATIENT EDUCATION:  Education details: Plan of care; improtance of continued progressions and mobility within tolerance  Person educated: Patient and significant other Education method: Psychiatrist  comprehension: verbalized understanding and needs further education  HOME EXERCISE PROGRAM: Access Code: [email or web address] URL: [email or web address] Date: 06/17/2024 Prepared by: Lamarr Citrin  Exercises - Pelvic Muscle Energy Technique With Flexion and Extension  - 1 x daily - 7 x weekly - 1 sets - 8 reps - 5s hold - Pelvic Muscle Energy Technique With Adduction and Abduction  - 1 x daily - 7 x weekly - 1 sets - 8 reps - 5s hold - Supine Figure 4 Piriformis Stretch  - 1 x daily - 7 x weekly - 6 reps - 10s hold - Supine Sciatic Nerve Glide  - 3 x daily - 7 x weekly - 1 sets - 15 reps  ASSESSMENT:  CLINICAL IMPRESSION: Patient is making good progress with skilled physical therapy as evidenced by her subjective reports, ODI score, functional mobility, objective measures, functional mobility, and progress toward her goals. She was able to meet her goal for an improved ODI score as she was able to achieve a  44% improvement since her initial evaluation. However, she continues to experience elevated pain levels with prolonged activities such as shopping and walking her dog. She was able to partially meet her goals for improve 30 second sit to stand test and 2 minute walk test with her elevated pain being the only limiting factor preventing her from meeting these goals. Today's treatment focused on familiar interventions due to increased fatigue and pain secondary from today's objective testing. She reported feeling tired upon the conclusion of treatment. Recommend that she continue with skilled physical therapy for 6 additional visits at twice per week to address her remaining impairments to return to her prior level of function.   OBJECTIVE IMPAIRMENTS: Abnormal gait, decreased activity tolerance, decreased balance, decreased endurance, decreased mobility, difficulty walking, decreased ROM, decreased strength, increased fascial restrictions, impaired flexibility, postural dysfunction, and pain.   ACTIVITY LIMITATIONS: carrying, lifting, sitting, standing, squatting, sleeping, stairs, and bed mobility  PARTICIPATION LIMITATIONS: driving, shopping, and community activity  PERSONAL FACTORS: Time since onset of injury/illness/exacerbation and 1-2 comorbidities: anxiety, T2DM are also affecting patient's functional outcome.   REHAB POTENTIAL: Fair multiple co morbidities and failed conservative treatment prior  CLINICAL DECISION MAKING: Evolving/moderate complexity  EVALUATION COMPLEXITY: Moderate   GOALS: Goals reviewed with patient? Yes  SHORT TERM GOALS: Target date: 07/08/24  Pt will be independent with HEP in order to demonstrate participation in Physical Therapy POC.  Baseline: she is doing her HEP every night Goal status: MET  2.  Pt will report 5/10 pain with mobility in order to demonstrate improved pain with ADLs.  Baseline: 4-5/10 initially, but can increase to 8/10 Goal status: IN  PROGRESS  LONG TERM GOALS: Target date: 09/03/24  Pt will improve 30s sit to stand by 5 repetitions maintaining <5/10 pain in order to demonstrate improved functional strength to return to desired activities.  Baseline:10 reps with pain up to 6/10 Goal status: PARTIALLY MET  2.  Pt will improve 2 MWT by 100' with <5/10 pain in order to demonstrate improved functional ambulatory capacity in community setting.  Baseline:352 feet with pain at 8/10 Goal status:PARTIALLY MET   3.  Pt will improve Modified Oswestry score by 6 points in order to demonstrate improved pain with functional goals and outcomes. Baseline: 42% disability on 07/28/24 Goal status: MET  4.  Pt will report <4/10 pain with mobility in order to demonstrate reduced pain with ADLs lasting greater than 30 minutes.  Baseline: up to 8/10 Goal status: IN PROGRESS  5.  Pt will report independence with caretaking of 30yr old son for at least 6 hours at a time in order to return to ADL independence and improved quality of life.  Baseline: able to take care of them for 2 days prior to crashing  Goal status: IN PROGRESS  PLAN:  PT FREQUENCY: 2x/week  PT DURATION: 6 weeks  PLANNED INTERVENTIONS: 97110-Therapeutic exercises, 97530- Therapeutic activity, [id number]- Neuromuscular re-education, 97535- Self Care, [id number]- Manual therapy, [phone]- Gait training, Patient/Family education, Balance training, and Stair training.  PLAN FOR NEXT SESSION: reassess alignment of SIJ each visit.  Reassess next session.     Lacinda JAYSON Fass, PT July 29, 2024, 10:58 AM    Managed Medicaid Authorization Request Treatment Start Date: Jul 29, 2024  Visit Dx Codes: M25.551, M54.31, M54.59   Functional Tool Score: ODI: 44% disability (22/50)  For all possible CPT codes, reference the Planned Interventions line above.     Check all conditions that are expected to impact treatment: {Conditions expected to impact treatment:Diabetes mellitus and  Psychological or psychiatric disorders   If treatment provided at initial evaluation, no treatment charged due to lack of authorization.

## 2024-08-02 ENCOUNTER — Encounter (HOSPITAL_COMMUNITY): Payer: Self-pay

## 2024-08-02 ENCOUNTER — Ambulatory Visit (HOSPITAL_COMMUNITY): Payer: MEDICAID

## 2024-08-02 DIAGNOSIS — M5431 Sciatica, right side: Secondary | ICD-10-CM

## 2024-08-02 DIAGNOSIS — M5459 Other low back pain: Secondary | ICD-10-CM

## 2024-08-02 DIAGNOSIS — M25551 Pain in right hip: Secondary | ICD-10-CM

## 2024-08-02 NOTE — Therapy (Signed)
 OUTPATIENT PHYSICAL THERAPY THORACOLUMBAR TREATMENT   Patient Name: Amber Henry MRN: [id number] DOB:08-Jun-1991, 33 y.o., female Today's Date: 08/02/2024  END OF SESSION:   PT End of Session - 08/02/24 0813     Visit Number 8    Number of Visits 13    Date for Recertification  09/09/24    Authorization Type [facility] [facility]    Authorization Time Period 07/28/24-01/24/25    Authorization - Visit Number 2    Authorization - Number of Visits 6    Progress Note Due on Visit --    PT Start Time 0814    PT Stop Time 0855    PT Time Calculation (min) 41 min    Activity Tolerance Patient limited by pain;Patient tolerated treatment well    Behavior During Therapy WFL for tasks assessed/performed                Past Medical History:  Diagnosis Date   ADHD (attention deficit hyperactivity disorder)    Anxiety    Asthma    Bipolar 1 disorder (HCC)    Borderline diabetes    DDD (degenerative disc disease), lumbar    Depression    Diabetes mellitus without complication (HCC)    Fibromyalgia    Past Surgical History:  Procedure Laterality Date   CHOLECYSTECTOMY     Patient Active Problem List   Diagnosis Date Noted   Uncontrolled type 2 diabetes mellitus with hyperglycemia (HCC) 01/18/2018   Hypothyroidism 01/18/2018   Class 2 severe obesity due to excess calories with serious comorbidity and body mass index (BMI) of 35.0 to 35.9 in adult 01/18/2018   Current smoker 01/18/2018   ILEUS 02/20/2010   NAUSEA AND VOMITING 02/15/2010   ABDOMINAL PAIN, UPPER 02/15/2010   TRANSAMINASES, SERUM, ELEVATED 02/15/2010    PCP: Colette Torrence GRADE, MD  REFERRING PROVIDER: Trudy Vaughn FALCON, MD  REFERRING DIAG: M54.30 (ICD-10-CM) - Sciatica   Rationale for Evaluation and Treatment: Rehabilitation  THERAPY DIAG:  Pain in right hip  Sciatica of right side  Other low back pain  ONSET DATE: 3 yrs ago when she had her first kid  SUBJECTIVE:                                                                                                                                                                                            SUBJECTIVE STATEMENT: Patient reports that she is extremely tired today. She has been tired the last few days.   Evaluation:  Started when I had my kid 3 years ago. Has been hell since with her back. She can't have her kid  at home without someone else there because of her pain. Once she had her hip pop back in the socket and felt no pain for a little bit which was the first time in a long time. But then it goes out and has been out still. Knees will go out on her when her hip is in socket which is why they prescribed me the wheelchair when I'm too weak to move. Not able to drive without heat and massage on her back. R leg will shake with long periods of time.  PERTINENT HISTORY:  POTS; bipolar 1; ADHD; anxiety; DM  PAIN:  Are you having pain? Yes: NPRS scale: 6/10 Pain location: R hip down to the lateral side past her knee (not all the time) Pain description: stabbing in her socket with searing hot coals with her back; R hip  Aggravating factors: walking (, sitting  Relieving factors: R hip flexed and L LE extended; W sitting  PRECAUTIONS: Other: POTS (passes out)  RED FLAGS: None   WEIGHT BEARING RESTRICTIONS: No  FALLS:  Has patient fallen in last 6 months? No  LIVING ENVIRONMENT: Lives with: lives with their spouse Lives in: Other with ramp Stairs: No Has following equipment at home: Wheelchair (manual)  OCCUPATION: permanently disabled since she was 2 yrs    PLOF: Independent  PATIENT GOALS: Be able to bend and walk around and take care of her kid  NEXT MD VISIT: unsure  OBJECTIVE:  Note: Objective measures were completed at Evaluation unless otherwise noted.  DIAGNOSTIC FINDINGS: *None of recent past couple years*  03/13/2022 - MRI Lumbar spine w/o contract IMPRESSION: 1. Marrow edema within the  pedicles on the right at L4 and L5, which may be degenerative and related to facet arthrosis, or may reflect stress reaction. 2. Marrow edema also present in the L5 pars interarticularis bilaterally. This may be degenerative and related to facet arthrosis, or may reflect stress reaction/developing pars interarticularis defects. 3. At L4-L5, there is mild disc degeneration. Small disc bulge. Moderate facet arthrosis with ligamentum flavum hypertrophy (greater on the left). Mild relative left subarticular narrowing (without nerve root impingement). Mild left neural foraminal narrowing. 4. At L5-S1, there is mild disc degeneration. Small disc bulge. Mild facet arthrosis and ligamentum flavum hypertrophy. No significant spinal canal or foraminal stenosis. 5. Minimal facet arthrosis at L3-L4.  PATIENT SURVEYS:  Modified Oswestry Low Back Pain Disability Questionnaire: 43 / 50 = 86.0 %  COGNITION: Overall cognitive status: Within functional limits for tasks assessed     SENSATION: Reports deficits with R LE numbness/difficulty with   MUSCLE LENGTH: HS bilaterally tightness - R tightness more than L  POSTURE: rounded shoulders, forward head, and consistent fwd/bwd weight shifting noted  PALPATION: TTP max along R SIJ; piriformis TTP (R); guarding in B glut/hip rotators  Thoracolumbar paraspinals hypertrophy and TTP R ASIS > L ASIS  LUMBAR ROM:   AROM eval  Flexion 40% (*)  Extension 5% (*)   Right lateral flexion 10% (*)  Left lateral flexion 30% (*)  Right rotation 60%  Left rotation 60%   (Blank rows = not tested)  LOWER EXTREMITY MMT:     Active  LEFT eval RIGHT eval  Hip flexion 4 4- painful  Hip extension 3+ 3+ painful  Hip abduction 4- 4- painful  Hip adduction    Hip internal rotation    Hip external rotation    Knee flexion 4 4- painful  Knee extension 4 4-  Ankle dorsiflexion 4  4-  Ankle plantarflexion 4- 4-  Ankle inversion    Ankle eversion     (Blank  rows = not tested)   LUMBAR SPECIAL TESTS: POSTIVE ON R side Straight leg raise test: Negative, Slump test: Positive, SI Compression/distraction test: Positive, and FABER test: Positive   FUNCTIONAL TESTS:  5 times sit to stand: 29s  2 minute walk test: NT - TBA  10/8:  220 feet no AD   30s Sit to stand: 5 reps - increased pain from 8 to 9/10 after 30s therefore deferred further testing GAIT: Distance walked: 63' w/ antalgic lateral gait and decreased rotation throughout  Assistive device utilized: None Level of assistance: SBA and sign other next to her at all times Comments: reports she has to use the w/c sometimes secondary to fatigue and buckling in R LE > L LE  TREATMENT DATE:                                    08/02/24 EXERCISE LOG  Exercise Repetitions and Resistance Comments  Lower trunk rotation  2 minutes    Double knee to chest  2 minutes  LE supported on green ball   Bridge w/ hip ADD isometric  20 reps    Isometric ball press   2 minutes w/ 5 second hold  50% max effort   Standing ball roll out  20 reps  For improved lumbar mobility   Seated glute sets  20 reps w/ 5 second hold    Resisted row  RTB x 15 reps    Resisted pull down  RTB x 15 reps    Pallof press  RTB x 15 reps each    Rocker board  3 minutes With BUE support from parallel bars    Blank cell = exercise not performed today                                    07/28/24 EXERCISE LOG  Exercise Repetitions and Resistance Comments  Goal assessment for progress note  See below    Standing donkey kick   15 reps each  BUE support from parallel bars   Squatting   15 reps  BUE support from parallel bars   Seated hip ADD isometric  1 minutes w/ 5 second hold  50% max effort  Seated heel/toe raise   20 reps each    Resisted pull down  RTB x 15 reps    Resisted punch out  RTB x 12 reps each  With lumbar rotation    Blank cell = exercise not performed today   07/26/24 Standing:  heelraises 10X  Lumbar  extension 10X  Hip abduction 10X each  Hip extension 10X each  Vectors with 1 UE assist 5X5 each LE  RTB rows 2X10  RTB shoulder extension 2X10  Pallof 2X10 each direction Supine: bridge 10X2 with feet on green physioball  Bridge and hamstring curl, roll in with green physioball 4 sets 5 reps  Single leg bridge Rt only 10X, Lt only 10X  Bridge with GTB abduction with lift 10X SLR 10X2 each side   07/19/24 SI check is normal Supine: bridge 10X  Single leg bridge Rt only 10X, Lt only 10X  Bridge with GTB abduction with lift 10X  SLR 10X each side  Fig 4 Stretch 3X20  LTR 10X  Hamstring stretch with UE holds 3X20 Sidelying clams 10X each side  07/15/24: SI assessment with Rt anterior hip rotation, Rt LE longer Attempted MET, pt limited by pain and minimal gluteal activation MHP on Rt hip/LBP Lt sidelying positoin TrA activation paired with cueing for breathing 10x 5 Glut sets 10x 5 MET for Rt anterior rotation then outflare Pubic clearance 10x 5  with abdominal sets (252ft)  PATIENT EDUCATION:  Education details: HEP  Person educated: Patient and significant other Education method: Explanation Education comprehension: verbalized understanding  HOME EXERCISE PROGRAM: Access Code: [email or web address] URL: [email or web address] Date: 06/17/2024 Prepared by: Lamarr Citrin  Exercises - Pelvic Muscle Energy Technique With Flexion and Extension  - 1 x daily - 7 x weekly - 1 sets - 8 reps - 5s hold - Pelvic Muscle Energy Technique With Adduction and Abduction  - 1 x daily - 7 x weekly - 1 sets - 8 reps - 5s hold - Supine Figure 4 Piriformis Stretch  - 1 x daily - 7 x weekly - 6 reps - 10s hold - Supine Sciatic Nerve Glide  - 3 x daily - 7 x weekly - 1 sets - 15 reps  ASSESSMENT:  CLINICAL IMPRESSION: Patient was progressed with familiar interventions for improved lumbar mobility and stability with moderate difficulty. She required minimal cueing with double  knee to chests for proper biomechanics to facilitate improved lumbar mobility. She was educated on the proper performance of her HEP to avoid overuse and an increase in pain. She reported understanding. She reported feeling tired and still hurting upon the conclusion of treatment. Patient continues to require skilled physical therapy to address her remaining impairments to return to her prior level of function.    OBJECTIVE IMPAIRMENTS: Abnormal gait, decreased activity tolerance, decreased balance, decreased endurance, decreased mobility, difficulty walking, decreased ROM, decreased strength, increased fascial restrictions, impaired flexibility, postural dysfunction, and pain.   ACTIVITY LIMITATIONS: carrying, lifting, sitting, standing, squatting, sleeping, stairs, and bed mobility  PARTICIPATION LIMITATIONS: driving, shopping, and community activity  PERSONAL FACTORS: Time since onset of injury/illness/exacerbation and 1-2 comorbidities: anxiety, T2DM are also affecting patient's functional outcome.   REHAB POTENTIAL: Fair multiple co morbidities and failed conservative treatment prior  CLINICAL DECISION MAKING: Evolving/moderate complexity  EVALUATION COMPLEXITY: Moderate   GOALS: Goals reviewed with patient? Yes  SHORT TERM GOALS: Target date: 07/08/24  Pt will be independent with HEP in order to demonstrate participation in Physical Therapy POC.  Baseline: she is doing her HEP every night Goal status: MET  2.  Pt will report 5/10 pain with mobility in order to demonstrate improved pain with ADLs.  Baseline: 4-5/10 initially, but can increase to 8/10 Goal status: IN PROGRESS  LONG TERM GOALS: Target date: 09/03/24  Pt will improve 30s sit to stand by 5 repetitions maintaining <5/10 pain in order to demonstrate improved functional strength to return to desired activities.  Baseline:10 reps with pain up to 6/10 Goal status: PARTIALLY MET  2.  Pt will improve 2 MWT by 100' with  <5/10 pain in order to demonstrate improved functional ambulatory capacity in community setting.  Baseline:352 feet with pain at 8/10 Goal status:PARTIALLY MET   3.  Pt will improve Modified Oswestry score by 6 points in order to demonstrate improved pain with functional goals and outcomes. Baseline: 42% disability on 07/28/24 Goal status: MET  4.  Pt will report <4/10 pain with mobility in order to demonstrate reduced pain with ADLs lasting greater than 30  minutes.  Baseline: up to 8/10 Goal status: IN PROGRESS   5.  Pt will report independence with caretaking of 53yr old son for at least 6 hours at a time in order to return to ADL independence and improved quality of life.  Baseline: able to take care of them for 2 days prior to crashing  Goal status: IN PROGRESS  PLAN:  PT FREQUENCY: 2x/week  PT DURATION: 6 weeks  PLANNED INTERVENTIONS: 97110-Therapeutic exercises, 97530- Therapeutic activity, [id number]- Neuromuscular re-education, 97535- Self Care, [id number]- Manual therapy, [phone]- Gait training, Patient/Family education, Balance training, and Stair training.  PLAN FOR NEXT SESSION: reassess alignment of SIJ each visit.  Reassess next session.     Lacinda JAYSON Fass, PT 08/02/2024, 9:02 AM

## 2024-08-04 ENCOUNTER — Encounter (HOSPITAL_COMMUNITY): Payer: MEDICAID | Admitting: Physical Therapy

## 2024-08-04 ENCOUNTER — Telehealth (HOSPITAL_COMMUNITY): Payer: Self-pay | Admitting: Physical Therapy

## 2024-08-04 NOTE — Telephone Encounter (Signed)
 Pt did not show for appt.  Called number but no answer and no VM available.   Greig KATHEE Fuse, PTA/CLT [facility] Ph: [phone]

## 2024-08-08 LAB — GENECONNECT MOLECULAR SCREEN: Genetic Analysis Overall Interpretation: NEGATIVE

## 2024-08-09 ENCOUNTER — Encounter (HOSPITAL_COMMUNITY): Payer: MEDICAID

## 2024-08-16 ENCOUNTER — Encounter (HOSPITAL_COMMUNITY): Payer: MEDICAID

## 2024-08-16 LAB — BASIC METABOLIC PANEL WITH GFR
BUN: 19 (ref 4–21)
Creatinine: 0.8 (ref 0.5–1.1)
Glucose: 370

## 2024-08-16 LAB — COMPREHENSIVE METABOLIC PANEL WITH GFR: eGFR: >90

## 2024-08-16 LAB — HEMOGLOBIN A1C: Hemoglobin A1C: 11.8

## 2024-08-18 ENCOUNTER — Ambulatory Visit (HOSPITAL_COMMUNITY): Payer: MEDICAID

## 2024-08-18 ENCOUNTER — Encounter (HOSPITAL_COMMUNITY): Payer: Self-pay

## 2024-08-18 DIAGNOSIS — M5459 Other low back pain: Secondary | ICD-10-CM | POA: Insufficient documentation

## 2024-08-18 DIAGNOSIS — M5431 Sciatica, right side: Secondary | ICD-10-CM | POA: Insufficient documentation

## 2024-08-18 DIAGNOSIS — M25551 Pain in right hip: Secondary | ICD-10-CM | POA: Insufficient documentation

## 2024-08-18 NOTE — Therapy (Signed)
 OUTPATIENT PHYSICAL THERAPY THORACOLUMBAR TREATMENT   Patient Name: Amber Henry MRN: [id number] DOB:May 10, 1991, 33 y.o., female Today's Date: 08/18/2024  END OF SESSION:   PT End of Session - 08/18/24 0823     Visit Number 9    Number of Visits 13    Date for Recertification  09/09/24    Authorization Type [facility] Plan    Authorization Time Period 07/28/24-01/24/25    Authorization - Visit Number 3    Authorization - Number of Visits 6    PT Start Time [phone]   Patient arrived late to her appointment.   PT Stop Time 0856    PT Time Calculation (min) 32 min    Activity Tolerance Patient limited by pain;Patient tolerated treatment well    Behavior During Therapy WFL for tasks assessed/performed                 Past Medical History:  Diagnosis Date   ADHD (attention deficit hyperactivity disorder)    Anxiety    Asthma    Bipolar 1 disorder (HCC)    Borderline diabetes    DDD (degenerative disc disease), lumbar    Depression    Diabetes mellitus without complication (HCC)    Fibromyalgia    Past Surgical History:  Procedure Laterality Date   CHOLECYSTECTOMY     Patient Active Problem List   Diagnosis Date Noted   Uncontrolled type 2 diabetes mellitus with hyperglycemia (HCC) 01/18/2018   Hypothyroidism 01/18/2018   Class 2 severe obesity due to excess calories with serious comorbidity and body mass index (BMI) of 35.0 to 35.9 in adult 01/18/2018   Current smoker 01/18/2018   ILEUS 02/20/2010   NAUSEA AND VOMITING 02/15/2010   ABDOMINAL PAIN, UPPER 02/15/2010   TRANSAMINASES, SERUM, ELEVATED 02/15/2010    PCP: Colette Torrence GRADE, MD  REFERRING PROVIDER: Trudy Vaughn FALCON, MD  REFERRING DIAG: M54.30 (ICD-10-CM) - Sciatica   Rationale for Evaluation and Treatment: Rehabilitation  THERAPY DIAG:  Pain in right hip  Sciatica of right side  Other low back pain  ONSET DATE: 3 yrs ago when she had her first kid  SUBJECTIVE:                                                                                                                                                                                            SUBJECTIVE STATEMENT: Patient reports that her hip is hurting today as it is at a 6/10 and climbing. She notes that her blood sugar has been running high and her medication is not helping.   Evaluation:  Started when I had my kid  3 years ago. Has been hell since with her back. She can't have her kid at home without someone else there because of her pain. Once she had her hip pop back in the socket and felt no pain for a little bit which was the first time in a long time. But then it goes out and has been out still. Knees will go out on her when her hip is in socket which is why they prescribed me the wheelchair when I'm too weak to move. Not able to drive without heat and massage on her back. R leg will shake with long periods of time.  PERTINENT HISTORY:  POTS; bipolar 1; ADHD; anxiety; DM  PAIN:  Are you having pain? Yes: NPRS scale: 6/10 Pain location: R hip down to the lateral side past her knee (not all the time) Pain description: stabbing in her socket with searing hot coals with her back; R hip  Aggravating factors: walking (, sitting  Relieving factors: R hip flexed and L LE extended; W sitting  PRECAUTIONS: Other: POTS (passes out)  RED FLAGS: None   WEIGHT BEARING RESTRICTIONS: No  FALLS:  Has patient fallen in last 6 months? No  LIVING ENVIRONMENT: Lives with: lives with their spouse Lives in: Other with ramp Stairs: No Has following equipment at home: Wheelchair (manual)  OCCUPATION: permanently disabled since she was 2 yrs    PLOF: Independent  PATIENT GOALS: Be able to bend and walk around and take care of her kid  NEXT MD VISIT: unsure  OBJECTIVE:  Note: Objective measures were completed at Evaluation unless otherwise noted.  DIAGNOSTIC FINDINGS: *None of recent past couple  years*  03/13/2022 - MRI Lumbar spine w/o contract IMPRESSION: 1. Marrow edema within the pedicles on the right at L4 and L5, which may be degenerative and related to facet arthrosis, or may reflect stress reaction. 2. Marrow edema also present in the L5 pars interarticularis bilaterally. This may be degenerative and related to facet arthrosis, or may reflect stress reaction/developing pars interarticularis defects. 3. At L4-L5, there is mild disc degeneration. Small disc bulge. Moderate facet arthrosis with ligamentum flavum hypertrophy (greater on the left). Mild relative left subarticular narrowing (without nerve root impingement). Mild left neural foraminal narrowing. 4. At L5-S1, there is mild disc degeneration. Small disc bulge. Mild facet arthrosis and ligamentum flavum hypertrophy. No significant spinal canal or foraminal stenosis. 5. Minimal facet arthrosis at L3-L4.  PATIENT SURVEYS:  Modified Oswestry Low Back Pain Disability Questionnaire: 43 / 50 = 86.0 %  COGNITION: Overall cognitive status: Within functional limits for tasks assessed     SENSATION: Reports deficits with R LE numbness/difficulty with   MUSCLE LENGTH: HS bilaterally tightness - R tightness more than L  POSTURE: rounded shoulders, forward head, and consistent fwd/bwd weight shifting noted  PALPATION: TTP max along R SIJ; piriformis TTP (R); guarding in B glut/hip rotators  Thoracolumbar paraspinals hypertrophy and TTP R ASIS > L ASIS  LUMBAR ROM:   AROM eval  Flexion 40% (*)  Extension 5% (*)   Right lateral flexion 10% (*)  Left lateral flexion 30% (*)  Right rotation 60%  Left rotation 60%   (Blank rows = not tested)  LOWER EXTREMITY MMT:     Active  LEFT eval RIGHT eval  Hip flexion 4 4- painful  Hip extension 3+ 3+ painful  Hip abduction 4- 4- painful  Hip adduction    Hip internal rotation    Hip external rotation  Knee flexion 4 4- painful  Knee extension 4 4-  Ankle  dorsiflexion 4 4-  Ankle plantarflexion 4- 4-  Ankle inversion    Ankle eversion     (Blank rows = not tested)   LUMBAR SPECIAL TESTS: POSTIVE ON R side Straight leg raise test: Negative, Slump test: Positive, SI Compression/distraction test: Positive, and FABER test: Positive   FUNCTIONAL TESTS:  5 times sit to stand: 29s  2 minute walk test: NT - TBA  10/8:  220 feet no AD   30s Sit to stand: 5 reps - increased pain from 8 to 9/10 after 30s therefore deferred further testing GAIT: Distance walked: 70' w/ antalgic lateral gait and decreased rotation throughout  Assistive device utilized: None Level of assistance: SBA and sign other next to her at all times Comments: reports she has to use the w/c sometimes secondary to fatigue and buckling in R LE > L LE  TREATMENT DATE:                                    08/18/24 EXERCISE LOG  Exercise Repetitions and Resistance Comments  Nustep  L3 x 7.5 minutes    Standing gastroc stretch 4 x 30 seconds    Standing hip ABD  16 reps each BUE support from parallel bars   Standing heel raise  22 reps    Standing ball roll out  30 reps  For lumbar flexion  Isometric ball press  20 reps  w/ 5 second hold  Standing   Sciatic nerve glide  15 reps  RLE only    Blank cell = exercise not performed today                                    08/02/24 EXERCISE LOG  Exercise Repetitions and Resistance Comments  Lower trunk rotation  2 minutes    Double knee to chest  2 minutes  LE supported on green ball   Bridge w/ hip ADD isometric  20 reps    Isometric ball press   2 minutes w/ 5 second hold  50% max effort   Standing ball roll out  20 reps  For improved lumbar mobility   Seated glute sets  20 reps w/ 5 second hold    Resisted row  RTB x 15 reps    Resisted pull down  RTB x 15 reps    Pallof press  RTB x 15 reps each    Rocker board  3 minutes With BUE support from parallel bars    Blank cell = exercise not performed today                                     07/28/24 EXERCISE LOG  Exercise Repetitions and Resistance Comments  Goal assessment for progress note  See below    Standing donkey kick   15 reps each  BUE support from parallel bars   Squatting   15 reps  BUE support from parallel bars   Seated hip ADD isometric  1 minutes w/ 5 second hold  50% max effort  Seated heel/toe raise   20 reps each    Resisted pull down  RTB x 15 reps    Resisted punch out  RTB x 12 reps each  With lumbar rotation    Blank cell = exercise not performed today   PATIENT EDUCATION:  Education details: HEP  Person educated: Patient and significant other Education method: Explanation Education comprehension: verbalized understanding  HOME EXERCISE PROGRAM: Access Code: [email or web address] URL: [email or web address] Date: 06/17/2024 Prepared by: Lamarr Citrin  Exercises - Pelvic Muscle Energy Technique With Flexion and Extension  - 1 x daily - 7 x weekly - 1 sets - 8 reps - 5s hold - Pelvic Muscle Energy Technique With Adduction and Abduction  - 1 x daily - 7 x weekly - 1 sets - 8 reps - 5s hold - Supine Figure 4 Piriformis Stretch  - 1 x daily - 7 x weekly - 6 reps - 10s hold - Supine Sciatic Nerve Glide  - 3 x daily - 7 x weekly - 1 sets - 15 reps  ASSESSMENT:  CLINICAL IMPRESSION: Patient was progressed with new and familiar interventions for improved functional mobility. She required minimal cueing with isometric ball presses to maintain upright stance needed for postural reeducation. She experienced a moderate increase in her familiar symptoms which limited her ability to complete today's interventions. Her pain was about a 8/10 upon the conclusion of treatment. Patient continues to require skilled physical therapy to address her remaining impairments to return to her prior level of function.    OBJECTIVE IMPAIRMENTS: Abnormal gait, decreased activity tolerance, decreased balance, decreased endurance, decreased mobility, difficulty  walking, decreased ROM, decreased strength, increased fascial restrictions, impaired flexibility, postural dysfunction, and pain.   ACTIVITY LIMITATIONS: carrying, lifting, sitting, standing, squatting, sleeping, stairs, and bed mobility  PARTICIPATION LIMITATIONS: driving, shopping, and community activity  PERSONAL FACTORS: Time since onset of injury/illness/exacerbation and 1-2 comorbidities: anxiety, T2DM are also affecting patient's functional outcome.   REHAB POTENTIAL: Fair multiple co morbidities and failed conservative treatment prior  CLINICAL DECISION MAKING: Evolving/moderate complexity  EVALUATION COMPLEXITY: Moderate   GOALS: Goals reviewed with patient? Yes  SHORT TERM GOALS: Target date: 07/08/24  Pt will be independent with HEP in order to demonstrate participation in Physical Therapy POC.  Baseline: she is doing her HEP every night Goal status: MET  2.  Pt will report 5/10 pain with mobility in order to demonstrate improved pain with ADLs.  Baseline: 4-5/10 initially, but can increase to 8/10 Goal status: IN PROGRESS  LONG TERM GOALS: Target date: 09/03/24  Pt will improve 30s sit to stand by 5 repetitions maintaining <5/10 pain in order to demonstrate improved functional strength to return to desired activities.  Baseline:10 reps with pain up to 6/10 Goal status: PARTIALLY MET  2.  Pt will improve 2 MWT by 100' with <5/10 pain in order to demonstrate improved functional ambulatory capacity in community setting.  Baseline:352 feet with pain at 8/10 Goal status:PARTIALLY MET   3.  Pt will improve Modified Oswestry score by 6 points in order to demonstrate improved pain with functional goals and outcomes. Baseline: 42% disability on 07/28/24 Goal status: MET  4.  Pt will report <4/10 pain with mobility in order to demonstrate reduced pain with ADLs lasting greater than 30 minutes.  Baseline: up to 8/10 Goal status: IN PROGRESS   5.  Pt will report  independence with caretaking of 90yr old son for at least 6 hours at a time in order to return to ADL independence and improved quality of life.  Baseline: able to take care of them for 2 days prior to crashing  Goal status: IN  PROGRESS  PLAN:  PT FREQUENCY: 2x/week  PT DURATION: 6 weeks  PLANNED INTERVENTIONS: 97110-Therapeutic exercises, 97530- Therapeutic activity, [id number]- Neuromuscular re-education, 97535- Self Care, [id number]- Manual therapy, [phone]- Gait training, Patient/Family education, Balance training, and Stair training.  PLAN FOR NEXT SESSION: reassess alignment of SIJ each visit.  Reassess next session.     Lacinda JAYSON Fass, PT 08/18/2024, 3:30 PM

## 2024-08-24 ENCOUNTER — Telehealth (HOSPITAL_COMMUNITY): Payer: Self-pay | Admitting: Physical Therapy

## 2024-08-24 ENCOUNTER — Ambulatory Visit (HOSPITAL_COMMUNITY): Payer: MEDICAID | Admitting: Physical Therapy

## 2024-08-24 NOTE — Telephone Encounter (Signed)
 Pt did not show today.  Today is pts 3rd NS and also with multiple cancellations over course of treatment.  Called and left VM regarding missed appt, NS policy and discharge.  Pt has no further appts scheduled at this time.   Greig KATHEE Fuse, PTA/CLT [facility] Ph: [phone]

## 2024-09-23 ENCOUNTER — Ambulatory Visit: Admitting: Nurse Practitioner

## 2024-10-06 NOTE — Patient Instructions (Signed)

## 2024-10-07 ENCOUNTER — Ambulatory Visit: Payer: MEDICAID | Admitting: Nurse Practitioner

## 2024-10-07 ENCOUNTER — Encounter: Payer: Self-pay | Admitting: Nurse Practitioner

## 2024-10-07 VITALS — BP 102/72 | HR 87 | Ht 60.0 in | Wt 142.6 lb

## 2024-10-07 DIAGNOSIS — Z794 Long term (current) use of insulin: Secondary | ICD-10-CM | POA: Diagnosis not present

## 2024-10-07 DIAGNOSIS — E1165 Type 2 diabetes mellitus with hyperglycemia: Secondary | ICD-10-CM

## 2024-10-07 DIAGNOSIS — Z7984 Long term (current) use of oral hypoglycemic drugs: Secondary | ICD-10-CM | POA: Diagnosis not present

## 2024-10-07 MED ORDER — NOVOLOG FLEXPEN 100 UNIT/ML ~~LOC~~ SOPN: 10.0000 [IU] | PEN_INJECTOR | Freq: Three times a day (TID) | SUBCUTANEOUS | 3 refills | Status: AC

## 2024-10-07 MED ORDER — PEN NEEDLES 31G X 6 MM MISC: 3 refills | Status: AC

## 2024-10-07 MED ORDER — TRESIBA FLEXTOUCH 100 UNIT/ML ~~LOC~~ SOPN: 40.0000 [IU] | PEN_INJECTOR | Freq: Every day | SUBCUTANEOUS | 1 refills | Status: AC

## 2024-10-07 MED ORDER — METFORMIN HCL 1000 MG PO TABS: 1000.0000 mg | ORAL_TABLET | Freq: Two times a day (BID) | ORAL | 1 refills | Status: AC

## 2024-10-07 NOTE — Progress Notes (Signed)
 "                                                                        Endocrinology Consult Note       10/07/2024, 10:22 AM   Subjective:    Patient ID: Amber Henry, female    DOB: Sep 07, 1991.  Amber Henry is being seen in consultation for management of currently uncontrolled symptomatic diabetes requested by  Verneda Charmaine SAUNDERS, FNP.   Past Medical History:  Diagnosis Date   ADHD (attention deficit hyperactivity disorder)    Anxiety    Asthma    Bipolar 1 disorder (HCC)    Borderline diabetes    DDD (degenerative disc disease), lumbar    Depression    Diabetes mellitus without complication (HCC)    Fibromyalgia     Past Surgical History:  Procedure Laterality Date   CHOLECYSTECTOMY      Social History   Socioeconomic History   Marital status: Significant Other    Spouse name: Not on file   Number of children: Not on file   Years of education: Not on file   Highest education level: Not on file  Occupational History   Not on file  Tobacco Use   Smoking status: Former    Types: Cigarettes   Smokeless tobacco: Never  Vaping Use   Vaping status: Never Used  Substance and Sexual Activity   Alcohol use: No    Comment: occ   Drug use: No   Sexual activity: Yes    Birth control/protection: None  Other Topics Concern   Not on file  Social History Narrative   Not on file   Social Drivers of Health   Tobacco Use: Medium Risk (10/07/2024)   Patient History    Smoking Tobacco Use: Former    Smokeless Tobacco Use: Never    Passive Exposure: Not on Actuary Strain: Not on file  Food Insecurity: No Food Insecurity (08/16/2024)   Received from [facility]   Epic    Within the past 12 months, you worried that your food would run out before you got the money to buy more.: Never true    Within the past 12 months, the food you bought just didn't last and you didn't have money to get more.: Never true  Transportation Needs: No  Transportation Needs (08/16/2024)   Received from [facility]   PRAPARE - Transportation    Lack of Transportation (Medical): No    Lack of Transportation (Non-Medical): No  Physical Activity: Not on file  Stress: Not on file  Social Connections: Not on file  Depression (PHQ2-9): Low Risk (01/08/2022)   Depression (PHQ2-9)    PHQ-2 Score: 0  Alcohol Screen: Not on file  Housing: Not on file  Utilities: Low Risk (08/16/2024)   Received from [facility]   Utilities    Within the past 12 months, have you been unable to get utilities(heat, electricity) when it was really needed?: No  Health Literacy: Not on file    Family History  Problem Relation Age of Onset   Cancer Mother        breast   Kidney disease Mother  kidney failure   Hypertension Other    Heart disease Other    Diabetes Other     Outpatient Encounter Medications as of 10/07/2024  Medication Sig   insulin aspart  (NOVOLOG  FLEXPEN) 100 UNIT/ML FlexPen Inject 10-16 Units into the skin 3 (three) times daily with meals.   insulin degludec  (TRESIBA  FLEXTOUCH) 100 UNIT/ML FlexTouch Pen Inject 40 Units into the skin daily.   Insulin Pen Needle (PEN NEEDLES) 31G X 6 MM MISC Use to inject insulin 4 times daily   [DISCONTINUED] insulin aspart  (NOVOLOG  FLEXPEN) 100 UNIT/ML FlexPen Inject 15-21 Units into the skin 3 (three) times daily before meals. (Patient taking differently: Inject 15-21 Units into the skin 3 (three) times daily before meals. Patient states that she is taking 30 units before going to bed)   [DISCONTINUED] metFORMIN  (GLUCOPHAGE ) 1000 MG tablet Take 1,000 mg by mouth 2 (two) times daily.   metFORMIN  (GLUCOPHAGE ) 1000 MG tablet Take 1 tablet (1,000 mg total) by mouth 2 (two) times daily with a meal.   [DISCONTINUED] insulin detemir  (LEVEMIR  FLEXTOUCH) 100 UNIT/ML FlexPen Inject 50 Units into the skin daily at 10 pm.   [DISCONTINUED] lamoTRIgine (LAMICTAL) 25 MG tablet 1 tablet 2 (two) times daily.  (Patient not taking: Reported on 10/07/2024)   [DISCONTINUED] levothyroxine  (SYNTHROID ) 100 MCG tablet Take 1 tablet (100 mcg total) by mouth daily before breakfast.   [DISCONTINUED] oxyCODONE -acetaminophen  (PERCOCET) 5-325 MG tablet Take 1-2 tablets by mouth every 6 (six) hours as needed. (Patient not taking: Reported on 10/07/2024)   No facility-administered encounter medications on file as of 10/07/2024.    ALLERGIES: Allergies[1]  VACCINATION STATUS: Immunization History  Administered Date(s) Administered   PFIZER Comirnaty(Gray Top)Covid-19 Tri-Sucrose Vaccine 02/19/2021    Diabetes She presents for her initial diabetic visit. She has type 2 diabetes mellitus. Onset time: diagnosed at 34 yrs old- was able to come off meds for years, recently rediagnosed. Her disease course has been fluctuating. There are no hypoglycemic associated symptoms. Associated symptoms include fatigue, polydipsia and polyuria. There are no hypoglycemic complications. There are no diabetic complications. Risk factors for coronary artery disease include diabetes mellitus, obesity and tobacco exposure. Current diabetic treatment includes insulin injections and oral agent (monotherapy) (currently taking 30 units of Novolog  at bedtime and Metformin  1000 mg po twice daily). She is compliant with treatment most of the time. Her weight is fluctuating minimally. She is following a generally unhealthy diet. When asked about meal planning, she reported none. She has not had a previous visit with a dietitian. Her home blood glucose trend is fluctuating minimally. Her breakfast blood glucose range is generally >200 mg/dl. Her lunch blood glucose range is generally >200 mg/dl. Her dinner blood glucose range is generally >200 mg/dl. Her bedtime blood glucose range is generally >200 mg/dl. Her overall blood glucose range is >200 mg/dl. (She presents today for her consultation (accompanied by her husband), with her CGM showing gross  hyperglycemia overall.  Her most recent A1c on 12/2 was 11.8%.  She drinks sweet tea and pepsi mostly and does not really eat on any routine pattern, finds herself snacking most of the day.  She does not exercise optimally, physically is unable to do so due to medical conditions.  She is UTD on eye exam, follows with podiatry.  Analysis of her CGM shows TIR 0%, TAR 100% (98% in level 2 hyperglycemia range), TBR 0% with a GMI of 11.5%.) An ACE inhibitor/angiotensin II receptor blocker is not being taken. She sees a podiatrist.Eye exam  is current.     Review of systems  Constitutional: + decreasing body weight, current Body mass index is 27.85 kg/m., no fatigue, no subjective hyperthermia, no subjective hypothermia Eyes: no blurry vision, no xerophthalmia ENT: no sore throat, no nodules palpated in throat, no dysphagia/odynophagia, no hoarseness Cardiovascular: no chest pain, no shortness of breath, no palpitations, no leg swelling Respiratory: no cough, no shortness of breath Gastrointestinal: no nausea/vomiting/diarrhea Musculoskeletal: no muscle/joint aches Skin: no rashes, no hyperemia Neurological: no tremors, no numbness, no tingling, no dizziness Psychiatric: no depression, no anxiety  Objective:     BP 102/72 (BP Location: Right Arm, Patient Position: Sitting, Cuff Size: Large)   Pulse 87   Ht 5' (1.524 m)   Wt 142 lb 9.6 oz (64.7 kg)   BMI 27.85 kg/m   Wt Readings from Last 3 Encounters:  10/07/24 142 lb 9.6 oz (64.7 kg)  07/14/24 160 lb 0.9 oz (72.6 kg)  02/21/20 160 lb (72.6 kg)     BP Readings from Last 3 Encounters:  10/07/24 102/72  07/14/24 126/83  04/13/20 (!) 156/91     Physical Exam- Limited  Constitutional:  Body mass index is 27.85 kg/m. , not in acute distress, normal state of mind Eyes:  EOMI, no exophthalmos Neck: Supple Cardiovascular: RRR, + murmur, rubs, or gallops, no edema Respiratory: Adequate breathing efforts, no crackles, rales, rhonchi, or  wheezing Musculoskeletal: no gross deformities, strength intact in all four extremities, no gross restriction of joint movements Skin:  no rashes, no hyperemia Neurological: no tremor with outstretched hands   Diabetic Foot Exam - Simple   No data filed      CMP ( most recent) CMP     Component Value Date/Time   NA 139 01/20/2020 1012   K 5.1 01/20/2020 1012   CL 101 01/20/2020 1012   CO2 26 01/20/2020 1012   GLUCOSE 315 (H) 01/20/2020 1012   BUN 19 08/16/2024 0000   CREATININE 0.8 08/16/2024 0000   CREATININE 0.63 01/20/2020 1012   CALCIUM 9.8 01/20/2020 1012   PROT 6.4 01/20/2020 1012   ALBUMIN 4.0 02/20/2010 1607   AST 17 01/20/2020 1012   ALT 28 01/20/2020 1012   ALKPHOS 61 02/20/2010 1607   BILITOT 0.6 01/20/2020 1012   EGFR >90 08/16/2024 0000   GFRNONAA 122 01/20/2020 1012     Diabetic Labs (most recent): Lab Results  Component Value Date   HGBA1C 11.8 08/16/2024   HGBA1C 11.6 01/11/2020   HGBA1C 12.7 (H) 06/15/2018     Lipid Panel ( most recent) Lipid Panel  No results found for: CHOL, TRIG, HDL, CHOLHDL, VLDL, LDLCALC, LDLDIRECT, LABVLDL    Lab Results  Component Value Date   TSH 3.72 02/17/2024   TSH 4.26 01/20/2020   TSH 4.93 08/15/2019   TSH 2.63 06/15/2018   TSH 2.79 12/03/2017   TSH 1.66 02/09/2010   FREET4 1.3 01/20/2020   FREET4 1.4 06/15/2018           Assessment & Plan:   1) Type 2 diabetes mellitus with hyperglycemia, unspecified whether long term insulin use (HCC) (Primary)    - Amber Henry has currently uncontrolled symptomatic type 2 DM since 34 years of age, with most recent A1c of 11.8 %.   -Recent labs reviewed.  - I had a long discussion with her about the progressive nature of diabetes and the pathology behind its complications. -her diabetes is not currently complicated but she remains at a high risk for more acute  and chronic complications which include CAD, CVA, CKD, retinopathy, and  neuropathy. These are all discussed in detail with her.  The following Lifestyle Medicine recommendations according to American College of Lifestyle Medicine [facility]) were discussed and offered to patient and she agrees to start the journey:  A. Whole Foods, Plant-based plate comprising of fruits and vegetables, plant-based proteins, whole-grain carbohydrates was discussed in detail with the patient.   A list for source of those nutrients were also provided to the patient.  Patient will use only water or unsweetened tea for hydration. B.  The need to stay away from risky substances including alcohol, smoking; obtaining 7 to 9 hours of restorative sleep, at least 150 minutes of moderate intensity exercise weekly, the importance of healthy social connections,  and stress reduction techniques were discussed. C.  A full color page of  Calorie density of various food groups per pound showing examples of each food groups was provided to the patient.  - I have counseled her on diet and weight management by adopting a carbohydrate restricted/protein rich diet. Patient is encouraged to switch to unprocessed or minimally processed complex starch and increased protein intake (animal or plant source), fruits, and vegetables. -  she is advised to stick to a routine mealtimes to eat 3 meals a day and avoid unnecessary snacks (to snack only to correct hypoglycemia).   - she acknowledges that there is a room for improvement in her food and drink choices. - Suggestion is made for her to avoid simple carbohydrates from her diet including Cakes, Sweet Desserts, Ice Cream, Soda (diet and regular), Sweet Tea, Candies, Chips, Cookies, Store Bought Juices, Alcohol in Excess of 1-2 drinks a day, Artificial Sweeteners, Coffee Creamer, and Sugar-free Products. This will help patient to have more stable blood glucose profile and potentially avoid unintended weight gain.  - she will be scheduled with Penny Crumpton, RDN, CDE for  diabetes education.  - I have approached her with the following individualized plan to manage her diabetes and patient agrees:   -Will start her on MDI to help gain control in timely manner.  I discussed and initiated Tresiba  40 units SQ nightly and adjusted her Novolog  to 10-16 units TID with meals if glucose is above 90 and she is eating (Specific instructions on how to titrate insulin dosage based on glucose readings given to patient in writing).  She demonstrated her ability to properly use the SSI chart today with me.  -she is encouraged to start/continue monitoring glucose 4 times daily (using her CGM), before meals and before bed, and to call the clinic if she has readings less than 70 or above 300 for 3 tests in a row.  - she is warned not to take insulin without proper monitoring per orders. - Adjustment parameters are given to her for hypo and hyperglycemia in writing.  - she is advised to continue Metformin  1000 mg po twice daily, therapeutically suitable for patient . - she will be considered for incretin therapy as appropriate next visit.  She does have history of ileus, so GLP products should be used with caution  - Specific targets for  A1c; LDL, HDL, and Triglycerides were discussed with the patient.  2) Blood Pressure /Hypertension:  her blood pressure is controlled to target without the use of antihypertensive medications.  She will be considered for ACE/ARB if BP elevated on 3 separate occasions.  3) Lipids/Hyperlipidemia:    There is no recent lipid panel available to review, nor is she  on any lipid lowering agents.  Lipid panel will be checked on subsequent visits.  4)  Weight/Diet:  her Body mass index is 27.85 kg/m.  -  clearly complicating her diabetes care.   she is a candidate for weight loss. I discussed with her the fact that loss of 5 - 10% of her  current body weight will have the most impact on her diabetes management.  Exercise, and detailed carbohydrates  information provided  -  detailed on discharge instructions.  5) Chronic Care/Health Maintenance: -she is not on ACEI/ARB or Statin medications and is encouraged to initiate and continue to follow up with Ophthalmology, Dentist, Podiatrist at least yearly or according to recommendations, and advised to stay away from smoking. I have recommended yearly flu vaccine and pneumonia vaccine at least every 5 years; moderate intensity exercise for up to 150 minutes weekly; and sleep for at least 7 hours a day.  - she is advised to maintain close follow up with Verneda Charmaine SAUNDERS, FNP for primary care needs, as well as her other providers for optimal and coordinated care.   - Time spent in this patient care: 60 min, which was spent in counseling her about her diabetes and the rest reviewing her blood glucose logs, discussing her hypoglycemia and hyperglycemia episodes, reviewing her current and previous labs/studies (including abstraction from other facilities) and medications doses and developing a long term treatment plan based on the latest standards of care/guidelines; and documenting her care.    Please refer to Patient Instructions for Blood Glucose Monitoring and Insulin/Medications Dosing Guide in media tab for additional information. Please also refer to Patient Self Inventory in the Media tab for reviewed elements of pertinent patient history.  Amber Henry participated in the discussions, expressed understanding, and voiced agreement with the above plans.  All questions were answered to her satisfaction. she is encouraged to contact clinic should she have any questions or concerns prior to her return visit.     Follow up plan: - Return in about 3 months (around 01/05/2025) for Diabetes F/U with A1c in office, No previsit labs, Bring meter and logs.    Benton Rio, [facility] [address] [place], [place] [address] Phone: [phone] Fax:  [phone]  10/07/2024, 10:22 AM       [1]  Allergies Allergen Reactions   Mushroom Anaphylaxis   Peanut Butter Flavoring Agent (Non-Screening) Shortness Of Breath   Pineapple Anaphylaxis   Sulfamethoxazole-Trimethoprim Hives and Shortness Of Breath   Amoxicillin Nausea And Vomiting   Bactrim Hives   Lithium    Other     Spandex gives patient rash Spandex gives patient rash    Penicillins Nausea And Vomiting   Ceclor [Cefaclor] Rash   Latex Rash    Any latex over 5%   "

## 2025-01-10 ENCOUNTER — Ambulatory Visit: Payer: MEDICAID | Admitting: Nurse Practitioner
